# Patient Record
Sex: Male | Born: 1990 | Race: White | Hispanic: No
Health system: Southern US, Community
[De-identification: ages and names within clinical notes are randomized; demographics above are authoritative.]

## PROBLEM LIST (undated history)

## (undated) DIAGNOSIS — E119 Type 2 diabetes mellitus without complications: Secondary | ICD-10-CM

## (undated) DIAGNOSIS — I10 Essential (primary) hypertension: Secondary | ICD-10-CM

## (undated) DIAGNOSIS — J40 Bronchitis, not specified as acute or chronic: Secondary | ICD-10-CM

## (undated) HISTORY — PX: TONSILLECTOMY: SUR1361

## (undated) HISTORY — DX: Type 2 diabetes mellitus without complications: E11.9

## (undated) HISTORY — PX: FRACTURE SURGERY: SHX138

---

## 2011-09-05 ENCOUNTER — Emergency Department: Payer: Self-pay | Admitting: Emergency Medicine

## 2013-08-13 ENCOUNTER — Emergency Department: Payer: Self-pay | Admitting: Emergency Medicine

## 2013-08-18 ENCOUNTER — Emergency Department: Payer: Self-pay | Admitting: Emergency Medicine

## 2014-09-22 ENCOUNTER — Encounter: Payer: Self-pay | Admitting: Emergency Medicine

## 2014-09-22 ENCOUNTER — Emergency Department
Admission: EM | Admit: 2014-09-22 | Discharge: 2014-09-22 | Disposition: A | Discharge status: Home / Self Care | Payer: Self-pay | Attending: Emergency Medicine | Admitting: Emergency Medicine

## 2014-09-22 DIAGNOSIS — R1032 Left lower quadrant pain: Secondary | ICD-10-CM | POA: Insufficient documentation

## 2014-09-22 DIAGNOSIS — R109 Unspecified abdominal pain: Secondary | ICD-10-CM

## 2014-09-22 DIAGNOSIS — R197 Diarrhea, unspecified: Secondary | ICD-10-CM | POA: Insufficient documentation

## 2014-09-22 DIAGNOSIS — R110 Nausea: Secondary | ICD-10-CM | POA: Insufficient documentation

## 2014-09-22 DIAGNOSIS — Z720 Tobacco use: Secondary | ICD-10-CM | POA: Insufficient documentation

## 2014-09-22 LAB — CBC WITH DIFFERENTIAL/PLATELET
BASOS ABS: 0.1 10*3/uL (ref 0–0.1)
BASOS PCT: 1 %
EOS ABS: 0.2 10*3/uL (ref 0–0.7)
Eosinophils Relative: 1 %
HCT: 46.3 % (ref 40.0–52.0)
HEMOGLOBIN: 15.2 g/dL (ref 13.0–18.0)
LYMPHS ABS: 2 10*3/uL (ref 1.0–3.6)
Lymphocytes Relative: 15 %
MCH: 27.8 pg (ref 26.0–34.0)
MCHC: 32.8 g/dL (ref 32.0–36.0)
MCV: 84.8 fL (ref 80.0–100.0)
Monocytes Absolute: 0.9 10*3/uL (ref 0.2–1.0)
Monocytes Relative: 6 %
NEUTROS PCT: 77 %
Neutro Abs: 10.8 10*3/uL — ABNORMAL HIGH (ref 1.4–6.5)
Platelets: 206 10*3/uL (ref 150–440)
RBC: 5.46 MIL/uL (ref 4.40–5.90)
RDW: 13.9 % (ref 11.5–14.5)
WBC: 13.9 10*3/uL — AB (ref 3.8–10.6)

## 2014-09-22 LAB — COMPREHENSIVE METABOLIC PANEL
ALBUMIN: 3.3 g/dL — AB (ref 3.5–5.0)
ALK PHOS: 56 U/L (ref 38–126)
ALT: 21 U/L (ref 17–63)
AST: 27 U/L (ref 15–41)
Anion gap: 7 (ref 5–15)
BUN: 10 mg/dL (ref 6–20)
CALCIUM: 8.9 mg/dL (ref 8.9–10.3)
CO2: 27 mmol/L (ref 22–32)
CREATININE: 0.94 mg/dL (ref 0.61–1.24)
Chloride: 105 mmol/L (ref 101–111)
GFR calc Af Amer: >60 mL/min (ref >60)
GFR calc non Af Amer: >60 mL/min (ref >60)
GLUCOSE: 147 mg/dL — AB (ref 65–99)
Potassium: 3.9 mmol/L (ref 3.5–5.1)
SODIUM: 139 mmol/L (ref 135–145)
Total Bilirubin: 0.7 mg/dL (ref 0.3–1.2)
Total Protein: 5.9 g/dL — ABNORMAL LOW (ref 6.5–8.1)

## 2014-09-22 LAB — URINALYSIS COMPLETE WITH MICROSCOPIC (ARMC ONLY)
Bilirubin Urine: NEGATIVE
Glucose, UA: NEGATIVE mg/dL
Hgb urine dipstick: NEGATIVE
Ketones, ur: NEGATIVE mg/dL
Nitrite: NEGATIVE
PH: 6 (ref 5.0–8.0)
PROTEIN: NEGATIVE mg/dL
Specific Gravity, Urine: 1.024 (ref 1.005–1.030)

## 2014-09-22 LAB — LIPASE, BLOOD: Lipase: 18 U/L — ABNORMAL LOW (ref 22–51)

## 2014-09-22 MED ORDER — ONDANSETRON HCL 4 MG/2ML IJ SOLN
4.0000 mg | Freq: Once | INTRAMUSCULAR | Status: DC
  Filled 2014-09-22: qty 2

## 2014-09-22 MED ORDER — SODIUM CHLORIDE 0.9 % IV BOLUS (SEPSIS): 1000.0000 mL | Freq: Once | INTRAVENOUS | Status: DC

## 2014-09-22 MED ORDER — MORPHINE SULFATE (PF) 4 MG/ML IV SOLN
4.0000 mg | Freq: Once | INTRAVENOUS | Status: DC
  Filled 2014-09-22: qty 1

## 2014-09-22 NOTE — ED Provider Notes (Addendum)
[facility] Emergency Department Provider Note  ____________________________________________  Time seen: Approximately 7:53 PM  I have reviewed the triage vital signs and the nursing notes.   HISTORY  Chief Complaint Abdominal Pain    HPI Jeremy Salas is a 24 y.o. male who is healthy presents today complaining of abdominal pain and loose stools. Started 2 days ago. Pain is mostly in the lower abdomen more on the right than the left. He has had no fever but felt warm. He has had decreased by mouth intake secondary to nausea but not actually vomiting. He has had no abdominal surgeries in his life. No melena bright red blood per rectum. He has had loose but not watery stools. He worked today. He does work in [employer]. No known sick contacts.  History reviewed. No pertinent past medical history.  There are no active problems to display for this patient.   History reviewed. No pertinent past surgical history.  No current outpatient prescriptions on file.  Allergies Review of patient's allergies indicates no known allergies.  No family history on file.  Social History Social History  Substance Use Topics  . Smoking status: Current Some Day Smoker  . Smokeless tobacco: None  . Alcohol Use: Yes    Review of Systems Constitutional: No fever/chills Eyes: No visual changes. ENT: No sore throat. Cardiovascular: Denies chest pain. Respiratory: Denies shortness of breath. Gastrointestinal: Positive abdominal pain.  Positive nausea, no vomiting.  Loose stools no frank watery diarrhea.  No constipation. Genitourinary: Negative for dysuria. Musculoskeletal: Negative for back pain. Skin: Negative for rash. Neurological: Negative for headaches, focal weakness or numbness. 10-point ROS otherwise negative.  ____________________________________________   PHYSICAL EXAM:  VITAL SIGNS: ED Triage Vitals  Enc Vitals Group     BP 09/22/14 1722 135/78 mmHg      Pulse Rate 09/22/14 1722 100     Resp 09/22/14 1722 20     Temp 09/22/14 1722 97.9 F (36.6 C)     Temp Source 09/22/14 1722 Oral     SpO2 09/22/14 1722 96 %     Weight 09/22/14 1722 300 lb (136.079 kg)     Height 09/22/14 1722 5\' 10"  (1.778 m)     Head Cir --      Peak Flow --      Pain Score 09/22/14 1723 8     Pain Loc --      Pain Edu? --      Excl. in GC? --     Constitutional: Alert and oriented. Well appearing and in no acute distress. Eyes: Conjunctivae are normal. PERRL. EOMI. Head: Atraumatic. Nose: No congestion/rhinnorhea. Mouth/Throat: Mucous membranes are moist.  Oropharynx non-erythematous. Neck: No stridor.   Cardiovascular: Normal rate, regular rhythm. Grossly normal heart sounds.  Good peripheral circulation. Respiratory: Normal respiratory effort.  No retractions. Lungs CTAB. Gastrointestinal: Soft obese, patient has lower abdominal discomfort, right greater than left but is present in both sides. No guarding or rebound No distention. No abdominal bruits. No CVA tenderness. Musculoskeletal: No lower extremity tenderness nor edema.  No joint effusions. Neurologic:  Normal speech and language. No gross focal neurologic deficits are appreciated. No gait instability. Skin:  Skin is warm, dry and intact. No rash noted. Psychiatric: Mood and affect are normal. Speech and behavior are normal.  ____________________________________________   LABS (all labs ordered are listed, but only abnormal results are displayed)  Labs Reviewed  CBC WITH DIFFERENTIAL/PLATELET - Abnormal; Notable for the following:  WBC 13.9 (*)    Neutro Abs 10.8 (*)    All other components within normal limits  COMPREHENSIVE METABOLIC PANEL - Abnormal; Notable for the following:    Glucose, Bld 147 (*)    Total Protein 5.9 (*)    Albumin 3.3 (*)    All other components within normal limits  LIPASE, BLOOD - Abnormal; Notable for the following:    Lipase 18 (*)    All other components  within normal limits  URINALYSIS COMPLETEWITH MICROSCOPIC ([facility] ONLY) - Abnormal; Notable for the following:    Color, Urine YELLOW (*)    APPearance CLEAR (*)    Leukocytes, UA TRACE (*)    Bacteria, UA RARE (*)    Squamous Epithelial / LPF 0-5 (*)    All other components within normal limits   ____________________________________________  _________________________________   PROCEDURES  Procedure(s) performed: None  Critical Care performed: None  ____________________________________________   INITIAL IMPRESSION / ASSESSMENT AND PLAN / ED COURSE  Pertinent labs & imaging results that were available during my care of the patient were reviewed by me and considered in my medical decision making (see chart for details).  Patient presents today complaining of loose stools, subjective fever at home, and lower abdominal pain. Actually have low suspicion for appendicitis given that he does have some lateralizing signs the right, we will obtain a CT scan to rule it out. Morbid obesity limits the exam to some extent. Cerumen could be an early appendicitis although I favor more likely gastroenteritis. We'll give the patient IV fluid. White count blood work otherwise noted. ____________________________________________   ----------------------------------------- 8:12 PM on 09/22/2014 -----------------------------------------  Patient refusing all interventions. He would like a work note but he does not wish IV or CT. He feels that this is gastroenteritis. He is likely right but given his pain and his obesity I was favoring CT. Given the patient refuses CT scan pain medication or any further dimension and is requesting discharge we will discharge him. Since her risk benefits and alternative refusing CT. He specifically understands that a missed appendicitis can cause either more invasive surgery, IV antibiotics for prolonged period or even death. Patient is aware of these risks and he  understands he must come back if he feels worse in any way. He understands the limitations based upon the emergency department by his refusal of further intervention and care. All that being said, the patient's abdomen is still benign at this time he does have minimal lower abdominal discomfort which does still favor the right over the left. Patient declined GU exam.  FINAL CLINICAL IMPRESSION(S) / ED DIAGNOSES  Final diagnoses:  None      Jeanmarie Plant, MD 09/22/14 1955  Jeanmarie Plant, MD 09/22/14 2013

## 2014-09-22 NOTE — ED Notes (Signed)
Into patient's room to start IV, IVF, and medicate for pain as ordered.  Patient refusing IV.  Discussed treatment and plan of care and need for IV.  Patient states understanding and refuses IV.  Dr. Alphonzo Lemmings notified.

## 2014-09-22 NOTE — Discharge Instructions (Signed)
U have lower abdominal pain. Sometimes, this can be appendicitis. We have offered to do an appendicitis workup including CT and you have refused. This is certainly your choice however does mean that we cannot rule out appendicitis. Appendicitis, if left untreated, can cause significant pathology. There are other possible problems in your abdomen that we cannot find without a CT as well. All this being the case, we strongly advised you  to return to the emergency room if youy have any new or worrisome symptoms including increased pain, fever, vomiting, or feel worse in any way or change her mind about further workup.

## 2014-09-22 NOTE — ED Notes (Signed)
Lower abd cramps, nausea without emesis, some diarrhea

## 2014-09-22 NOTE — ED Notes (Signed)
AAOx3.  Skin warm and dry.  NAD. Ambulates with easy and steady gait.  NAD.  D/C home

## 2014-09-24 ENCOUNTER — Emergency Department: Payer: Self-pay

## 2014-09-24 ENCOUNTER — Emergency Department
Admission: EM | Admit: 2014-09-24 | Discharge: 2014-09-24 | Disposition: A | Discharge status: Home / Self Care | Payer: Self-pay | Attending: Emergency Medicine | Admitting: Emergency Medicine

## 2014-09-24 ENCOUNTER — Encounter: Payer: Self-pay | Admitting: Emergency Medicine

## 2014-09-24 DIAGNOSIS — R1084 Generalized abdominal pain: Secondary | ICD-10-CM

## 2014-09-24 DIAGNOSIS — K5732 Diverticulitis of large intestine without perforation or abscess without bleeding: Secondary | ICD-10-CM | POA: Insufficient documentation

## 2014-09-24 DIAGNOSIS — Z720 Tobacco use: Secondary | ICD-10-CM | POA: Insufficient documentation

## 2014-09-24 LAB — LIPASE, BLOOD: Lipase: 25 U/L (ref 22–51)

## 2014-09-24 LAB — URINALYSIS COMPLETE WITH MICROSCOPIC (ARMC ONLY)
BACTERIA UA: NONE SEEN
BILIRUBIN URINE: NEGATIVE
GLUCOSE, UA: NEGATIVE mg/dL
HGB URINE DIPSTICK: NEGATIVE
Ketones, ur: NEGATIVE mg/dL
LEUKOCYTES UA: NEGATIVE
NITRITE: NEGATIVE
Protein, ur: NEGATIVE mg/dL
SPECIFIC GRAVITY, URINE: 1.019 (ref 1.005–1.030)
SQUAMOUS EPITHELIAL / LPF: NONE SEEN
pH: 8 (ref 5.0–8.0)

## 2014-09-24 LAB — COMPREHENSIVE METABOLIC PANEL
ALBUMIN: 3.3 g/dL — AB (ref 3.5–5.0)
ALK PHOS: 49 U/L (ref 38–126)
ALT: 19 U/L (ref 17–63)
AST: 22 U/L (ref 15–41)
Anion gap: 5 (ref 5–15)
BILIRUBIN TOTAL: 0.7 mg/dL (ref 0.3–1.2)
BUN: 9 mg/dL (ref 6–20)
CO2: 28 mmol/L (ref 22–32)
CREATININE: 0.81 mg/dL (ref 0.61–1.24)
Calcium: 8.6 mg/dL — ABNORMAL LOW (ref 8.9–10.3)
Chloride: 105 mmol/L (ref 101–111)
GFR calc Af Amer: >60 mL/min (ref >60)
GLUCOSE: 125 mg/dL — AB (ref 65–99)
Potassium: 4 mmol/L (ref 3.5–5.1)
Sodium: 138 mmol/L (ref 135–145)
TOTAL PROTEIN: 6.1 g/dL — AB (ref 6.5–8.1)

## 2014-09-24 LAB — CBC
HEMATOCRIT: 40.5 % (ref 40.0–52.0)
Hemoglobin: 13.7 g/dL (ref 13.0–18.0)
MCH: 28.7 pg (ref 26.0–34.0)
MCHC: 33.8 g/dL (ref 32.0–36.0)
MCV: 85.1 fL (ref 80.0–100.0)
PLATELETS: 183 10*3/uL (ref 150–440)
RBC: 4.76 MIL/uL (ref 4.40–5.90)
RDW: 13.9 % (ref 11.5–14.5)
WBC: 9.9 10*3/uL (ref 3.8–10.6)

## 2014-09-24 MED ORDER — CIPROFLOXACIN HCL 500 MG PO TABS: 500.0000 mg | ORAL_TABLET | Freq: Two times a day (BID) | ORAL | Status: AC

## 2014-09-24 MED ORDER — SODIUM CHLORIDE 0.9 % IV BOLUS (SEPSIS)
1000.0000 mL | Freq: Once | INTRAVENOUS | Status: AC
  Administered 2014-09-24: 1000 mL via INTRAVENOUS

## 2014-09-24 MED ORDER — METOCLOPRAMIDE HCL 5 MG PO TABS: 5.0000 mg | ORAL_TABLET | Freq: Three times a day (TID) | ORAL | Status: DC

## 2014-09-24 MED ORDER — TRAMADOL HCL 50 MG PO TABS: 50.0000 mg | ORAL_TABLET | Freq: Four times a day (QID) | ORAL | Status: AC | PRN

## 2014-09-24 MED ORDER — IOHEXOL 300 MG/ML  SOLN
125.0000 mL | Freq: Once | INTRAMUSCULAR | Status: AC | PRN
  Administered 2014-09-24: 125 mL via INTRAVENOUS
  Filled 2014-09-24: qty 125

## 2014-09-24 MED ORDER — METRONIDAZOLE 500 MG PO TABS: 500.0000 mg | ORAL_TABLET | Freq: Two times a day (BID) | ORAL | Status: DC

## 2014-09-24 MED ORDER — IOHEXOL 240 MG/ML SOLN
25.0000 mL | Freq: Once | INTRAMUSCULAR | Status: AC | PRN
  Administered 2014-09-24: 25 mL via ORAL

## 2014-09-24 NOTE — ED Notes (Signed)
Pt to ed with c/o abd pain since sat.  Pt was seen here for same on sat.  Pt states pain in central abd.  Pt states "it feels like I need to go to the bathroom"  Pt denies n/v/d.  Pt appears in nad at triage.

## 2014-09-24 NOTE — ED Provider Notes (Signed)
[facility] Emergency Department Provider Note  ____________________________________________  Time seen: 1420  I have reviewed the triage vital signs and the nursing notes.   HISTORY  Chief Complaint Abdominal Pain     HPI Jeremy Salas is a 24 y.o. male who was seen in the emergency department 2 days ago with similar symptoms. He was having abdominal pain that began on Thursday. He's had some loose bowel movements, although he says that has decreased. He has had some nausea, but not now. He reports ongoing diffuse abdominal pain. Apparently when he was seen here on Saturday his pain was worse on the right than the left. Now it appears to be equal.  When the patient was seen here 2 days ago, the physician caring for him had wanted to get a CT scan on the patient. The patient refused and left the emergency department. The patient now returns due to ongoing discomfort and encouragement from his family. He is here with his grandfather.   History reviewed. No pertinent past medical history.  There are no active problems to display for this patient.   History reviewed. No pertinent past surgical history.  Current Outpatient Rx  Name  Route  Sig  Dispense  Refill  . ciprofloxacin (CIPRO) 500 MG tablet   Oral   Take 1 tablet (500 mg total) by mouth 2 (two) times daily.   14 tablet   0   . metoCLOPramide (REGLAN) 5 MG tablet   Oral   Take 1 tablet (5 mg total) by mouth 3 (three) times daily.   15 tablet   0   . metroNIDAZOLE (FLAGYL) 500 MG tablet   Oral   Take 1 tablet (500 mg total) by mouth 2 (two) times daily.   14 tablet   0   . traMADol (ULTRAM) 50 MG tablet   Oral   Take 1 tablet (50 mg total) by mouth every 6 (six) hours as needed.   20 tablet   0     Allergies Review of patient's allergies indicates no known allergies.  History reviewed. No pertinent family history.  Social History Social History  Substance Use Topics  .  Smoking status: Current Some Day Smoker  . Smokeless tobacco: None  . Alcohol Use: Yes    Review of Systems  Constitutional: Negative for fever. ENT: Negative for sore throat. Cardiovascular: Negative for chest pain. Respiratory: Negative for shortness of breath. Gastrointestinal: Positive for abdominal pain. Genitourinary: Negative for dysuria. Musculoskeletal: No myalgias or injuries. Skin: Negative for rash. Neurological: Negative for headaches   10-point ROS otherwise negative.  ____________________________________________   PHYSICAL EXAM:  VITAL SIGNS: ED Triage Vitals  Enc Vitals Group     BP 09/24/14 1139 128/75 mmHg     Pulse Rate 09/24/14 1139 78     Resp 09/24/14 1139 18     Temp 09/24/14 1139 98.2 F (36.8 C)     Temp Source 09/24/14 1139 Oral     SpO2 09/24/14 1139 97 %     Weight 09/24/14 1139 300 lb (136.079 kg)     Height 09/24/14 1139  (1.778 m)     Head Cir --      Peak Flow --      Pain Score 09/24/14 1147 9     Pain Loc --      Pain Edu? --      Excl. in GC? --     Constitutional:  Alert and oriented. Well appearing and  in no distress. ENT   Head: Normocephalic and atraumatic.   Nose: No congestion/rhinnorhea.   Mouth/Throat: Mucous membranes are moist. Cardiovascular: Normal rate, regular rhythm, no murmur noted Respiratory:  Normal respiratory effort, no tachypnea.    Breath sounds are clear and equal bilaterally.  Gastrointestinal: Soft, large, without distention. He has diffuse tenderness. No localization into the right or right lower quadrant. No peritoneal signs.  Back: No muscle spasm, no tenderness, no CVA tenderness. Musculoskeletal: No deformity noted. Nontender with normal range of motion in all extremities.  No noted edema. Neurologic:  Normal speech and language. No gross focal neurologic deficits are appreciated.  Skin:  Skin is warm, dry. No rash noted. Psychiatric: Mood and affect are normal. Speech and  behavior are normal.  ____________________________________________    LABS (pertinent positives/negatives)  Labs Reviewed  COMPREHENSIVE METABOLIC PANEL - Abnormal; Notable for the following:    Glucose, Bld 125 (*)    Calcium 8.6 (*)    Total Protein 6.1 (*)    Albumin 3.3 (*)    All other components within normal limits  URINALYSIS COMPLETEWITH MICROSCOPIC ([facility] ONLY) - Abnormal; Notable for the following:    Color, Urine YELLOW (*)    APPearance CLEAR (*)    All other components within normal limits  LIPASE, BLOOD  CBC     ____________________________________________  RADIOLOGY  CT scan abdomen and pelvis: IMPRESSION: Findings most consistent with diverticulitis of the splenic flexure of the colon. Wall thickening appears concentric and less consistent with the possibility of mass although followup to ensure no underlying abnormality recommended.  ____________________________________________   PROCEDURES    ____________________________________________   INITIAL IMPRESSION / ASSESSMENT AND PLAN / ED COURSE  Pertinent labs & imaging results that were available during my care of the patient were reviewed by me and considered in my medical decision making (see chart for details).  Pleasant, alert, 24 year old male with ongoing diffuse abdominal discomfort. I reviewed his lab results.   I've low suspicion for appendicitis and I discussed this with him and his grandfather. I've offered them the choice of treatment with just antibiotics for this loose stool and crampy abdominal pain, versus performing the CT scan that was initially considered 2 days ago. The patient and the grandfather prefer to have the CT done at this time. They understand the downside to radiation exposure, the risk and benefits of the procedure, and prefer this more thorough evaluation of this time.  ----------------------------------------- 4:32 PM on  09/24/2014 -----------------------------------------  CT scan shows findings consistent with diverticulitis of the sponge flexure of the colon.  Reassessment of the patient's time finds him comfortable, in no acute distress. I have outlined the diagnosis with him and the treatment plan.  We will start the patient on Cipro and metronidazole and asked to follow-up with another physician within 7-10 days.  I will discharge the patient with a prescription for Reglan and tramadol as well. I've also counseled him to use MiraLAX over the next 2-3 days.  ____________________________________________   FINAL CLINICAL IMPRESSION(S) / ED DIAGNOSES  Final diagnoses:  Generalized abdominal pain  Diverticulitis of large intestine without perforation or abscess without bleeding       Darien Ramus, MD 09/24/14 1640

## 2014-09-24 NOTE — ED Notes (Signed)
Pt IV infiltrated while in CT.

## 2014-09-24 NOTE — Discharge Instructions (Signed)
Diverticulitis °Diverticulitis is when small pockets that have formed in your colon (large intestine) become infected or swollen. °HOME CARE °· Follow your doctor's instructions. °· Follow a special diet if told by your doctor. °· When you feel better, your doctor may tell you to change your diet. You may be told to eat a lot of fiber. Fruits and vegetables are good sources of fiber. Fiber makes it easier to poop (have bowel movements). °· Take supplements or probiotics as told by your doctor. °· Only take medicines as told by your doctor. °· Keep all follow-up visits with your doctor. °GET HELP IF: °· Your pain does not get better. °· You have a hard time eating food. °· You are not pooping like normal. °GET HELP RIGHT AWAY IF: °· Your pain gets worse. °· Your problems do not get better. °· Your problems suddenly get worse. °· You have a fever. °· You keep throwing up (vomiting). °· You have bloody or black, tarry poop (stool). °MAKE SURE YOU:  °· Understand these instructions. °· Will watch your condition. °· Will get help right away if you are not doing well or get worse. °Document Released: 06/10/2007 Document Revised: 12/27/2012 Document Reviewed: 11/16/2012 °[employer]® Patient Information ©2015 [employer]. This information is not intended to replace advice given to you by your health care provider. Make sure you discuss any questions you have with your health care provider. ° °

## 2014-09-24 NOTE — ED Notes (Signed)
Pt back from CT

## 2014-11-26 ENCOUNTER — Emergency Department
Admission: EM | Admit: 2014-11-26 | Discharge: 2014-11-26 | Disposition: A | Discharge status: Home / Self Care | Payer: Self-pay | Attending: Emergency Medicine | Admitting: Emergency Medicine

## 2014-11-26 ENCOUNTER — Encounter: Payer: Self-pay | Admitting: Emergency Medicine

## 2014-11-26 DIAGNOSIS — R05 Cough: Secondary | ICD-10-CM

## 2014-11-26 DIAGNOSIS — Z79899 Other long term (current) drug therapy: Secondary | ICD-10-CM | POA: Insufficient documentation

## 2014-11-26 DIAGNOSIS — Z792 Long term (current) use of antibiotics: Secondary | ICD-10-CM | POA: Insufficient documentation

## 2014-11-26 DIAGNOSIS — R059 Cough, unspecified: Secondary | ICD-10-CM

## 2014-11-26 DIAGNOSIS — J029 Acute pharyngitis, unspecified: Secondary | ICD-10-CM | POA: Insufficient documentation

## 2014-11-26 DIAGNOSIS — R030 Elevated blood-pressure reading, without diagnosis of hypertension: Secondary | ICD-10-CM | POA: Insufficient documentation

## 2014-11-26 DIAGNOSIS — H109 Unspecified conjunctivitis: Secondary | ICD-10-CM | POA: Insufficient documentation

## 2014-11-26 DIAGNOSIS — H10023 Other mucopurulent conjunctivitis, bilateral: Secondary | ICD-10-CM

## 2014-11-26 DIAGNOSIS — F17200 Nicotine dependence, unspecified, uncomplicated: Secondary | ICD-10-CM | POA: Insufficient documentation

## 2014-11-26 DIAGNOSIS — J0100 Acute maxillary sinusitis, unspecified: Secondary | ICD-10-CM | POA: Insufficient documentation

## 2014-11-26 MED ORDER — PSEUDOEPH-BROMPHEN-DM 30-2-10 MG/5ML PO SYRP: 5.0000 mL | ORAL_SOLUTION | Freq: Four times a day (QID) | ORAL | Status: DC | PRN

## 2014-11-26 MED ORDER — GENTAMICIN SULFATE 0.3 % OP SOLN: 1.0000 [drp] | Freq: Three times a day (TID) | OPHTHALMIC | Status: AC

## 2014-11-26 MED ORDER — AMOXICILLIN 500 MG PO CAPS: 500.0000 mg | ORAL_CAPSULE | Freq: Three times a day (TID) | ORAL | Status: DC

## 2014-11-26 NOTE — ED Provider Notes (Signed)
[facility] Emergency Department Provider Note  ____________________________________________  Time seen: Approximately 5:29 PM  I have reviewed the triage vital signs and the nursing notes.   HISTORY  Chief Complaint Cough    HPI Jeremy Salas is a 24 y.o. male patient complain a nonproductive cough for several days. Patient also states feeling hot and cold. Patient had bilateral eye drainage which is yellowish greenish in color. Patient also complaining of a sore throat. Patient stated there is nasal discharge which is thick and greenish in color.. Patient has been using over-the-counter cough medications with no relief. Patient denies any nausea vomiting diarrhea. Patient rates his pain discomfort as a 7/10.   History reviewed. No pertinent past medical history.  There are no active problems to display for this patient.   History reviewed. No pertinent past surgical history.  Current Outpatient Rx  Name  Route  Sig  Dispense  Refill  . amoxicillin (AMOXIL) 500 MG capsule   Oral   Take 1 capsule (500 mg total) by mouth 3 (three) times daily.   30 capsule   0   . brompheniramine-pseudoephedrine-DM 30-2-10 MG/5ML syrup   Oral   Take 5 mLs by mouth 4 (four) times daily as needed.   120 mL   0   . gentamicin (GARAMYCIN) 0.3 % ophthalmic solution   Both Eyes   Place 1 drop into both eyes 3 (three) times daily.   5 mL   0   . metoCLOPramide (REGLAN) 5 MG tablet   Oral   Take 1 tablet (5 mg total) by mouth 3 (three) times daily.   15 tablet   0   . metroNIDAZOLE (FLAGYL) 500 MG tablet   Oral   Take 1 tablet (500 mg total) by mouth 2 (two) times daily.   14 tablet   0   . traMADol (ULTRAM) 50 MG tablet   Oral   Take 1 tablet (50 mg total) by mouth every 6 (six) hours as needed.   20 tablet   0     Allergies Review of patient's allergies indicates no known allergies.  No family history on file.  Social History Social History   Substance Use Topics  . Smoking status: Current Some Day Smoker  . Smokeless tobacco: None  . Alcohol Use: Yes    Review of Systems Constitutional: No objective fever or chills Eyes: No visual changes. Bilateral eye discharge ENT: Sore throat. Nasal and sinus congestion  Cardiovascular: Denies chest pain. Respiratory: Denies shortness of breath. Nonproductive cough Gastrointestinal: No abdominal pain.  No nausea, no vomiting.  No diarrhea.  No constipation. Genitourinary: Negative for dysuria. Musculoskeletal: Negative for back pain. Skin: Negative for rash. Neurological: Negative for headaches, focal weakness or numbness. 10-point ROS otherwise negative.  ____________________________________________   PHYSICAL EXAM:  VITAL SIGNS: ED Triage Vitals  Enc Vitals Group     BP 11/26/14 1725 159/75 mmHg     Pulse Rate 11/26/14 1725 108     Resp 11/26/14 1725 18     Temp 11/26/14 1725 98.9 F (37.2 C)     Temp Source 11/26/14 1725 Oral     SpO2 11/26/14 1725 99 %     Weight 11/26/14 1725 300 lb (136.079 kg)     Height 11/26/14 1725  (1.778 m)     Head Cir --      Peak Flow --      Pain Score 11/26/14 1726 7     Pain Loc --  Pain Edu? --      Excl. in GC? --     Constitutional: Alert and oriented. Well appearing and in no acute distress. Eyes: Conjunctivae are erythematous. PERRL. EOMI. bilateral dry secretions Head: Atraumatic. Nose: Edematous nasal turbinates with thick nasal discharge Mouth/Throat: Mucous membranes are moist.  Oropharynx erythematous. Postnasal drainage Neck: No stridor.  No cervical spine tenderness to palpation. Hematological/Lymphatic/Immunilogical: No cervical lymphadenopathy. Cardiovascular: Normal rate, regular rhythm. Grossly normal heart sounds.  Good peripheral circulation. Elevated blood pressure Respiratory: Normal respiratory effort.  No retractions. Lungs CTAB. Gastrointestinal: Soft and nontender. No distention. No abdominal  bruits. No CVA tenderness. Musculoskeletal: No lower extremity tenderness nor edema.  No joint effusions. Neurologic:  Normal speech and language. No gross focal neurologic deficits are appreciated. No gait instability. Skin:  Skin is warm, dry and intact. No rash noted. Psychiatric: Mood and affect are normal. Speech and behavior are normal.  ____________________________________________   LABS (all labs ordered are listed, but only abnormal results are displayed)  Labs Reviewed - No data to display ____________________________________________  EKG   ____________________________________________  RADIOLOGY   ____________________________________________   PROCEDURES  Procedure(s) performed: None  Critical Care performed: No  ____________________________________________   INITIAL IMPRESSION / ASSESSMENT AND PLAN / ED COURSE  Pertinent labs & imaging results that were available during my care of the patient were reviewed by me and considered in my medical decision making (see chart for details).  Bacterial conjunctivitis. Sinusitis. Nonproductive cough. Patient get a prescription for Amoxil, Bromfed-DM, and Garamycin ophthalmic solution. Patient given a work note for 2 days. Patient advised [facility]. ____________________________________________   FINAL CLINICAL IMPRESSION(S) / ED DIAGNOSES  Final diagnoses:  Acute bacterial conjunctivitis, bilateral  Acute non-recurrent maxillary sinusitis  Cough      Joni ReiningRonald K Smith, PA-C 11/26/14 1745  Jennye MoccasinBrian S Quigley, MD 11/28/14 [phone]

## 2014-11-26 NOTE — ED Notes (Signed)
Cough and cold sx;s for couple of day    Possible fever and chills.. Also has had drainage to eyes and sore throat

## 2014-11-26 NOTE — Discharge Instructions (Signed)
Bacterial Conjunctivitis Bacterial conjunctivitis, commonly called pink eye, is an inflammation of the clear membrane that covers the white part of the eye (conjunctiva). The inflammation can also happen on the underside of the eyelids. The blood vessels in the conjunctiva become inflamed, causing the eye to become red or pink. Bacterial conjunctivitis may spread easily from one eye to another and from person to person (contagious).  CAUSES  Bacterial conjunctivitis is caused by bacteria. The bacteria may come from your own skin, your upper respiratory tract, or from someone else with bacterial conjunctivitis. SYMPTOMS  The normally white color of the eye or the underside of the eyelid is usually pink or red. The pink eye is usually associated with irritation, tearing, and some sensitivity to light. Bacterial conjunctivitis is often associated with a thick, yellowish discharge from the eye. The discharge may turn into a crust on the eyelids overnight, which causes your eyelids to stick together. If a discharge is present, there may also be some blurred vision in the affected eye. DIAGNOSIS  Bacterial conjunctivitis is diagnosed by your caregiver through an eye exam and the symptoms that you report. Your caregiver looks for changes in the surface tissues of your eyes, which may point to the specific type of conjunctivitis. A sample of any discharge may be collected on a cotton-tip swab if you have a severe case of conjunctivitis, if your cornea is affected, or if you keep getting repeat infections that do not respond to treatment. The sample will be sent to a lab to see if the inflammation is caused by a bacterial infection and to see if the infection will respond to antibiotic medicines. TREATMENT   Bacterial conjunctivitis is treated with antibiotics. Antibiotic eyedrops are most often used. However, antibiotic ointments are also available. Antibiotics pills are sometimes used. Artificial tears or eye  washes may ease discomfort. HOME CARE INSTRUCTIONS   To ease discomfort, apply a cool, clean washcloth to your eye for 10-20 minutes, 3-4 times a day.  Gently wipe away any drainage from your eye with a warm, wet washcloth or a cotton ball.  Wash your hands often with soap and water. Use paper towels to dry your hands.  Do not share towels or washcloths. This may spread the infection.  Change or wash your pillowcase every day.  You should not use eye makeup until the infection is gone.  Do not operate machinery or drive if your vision is blurred.  Stop using contact lenses. Ask your caregiver how to sterilize or replace your contacts before using them again. This depends on the type of contact lenses that you use.  When applying medicine to the infected eye, do not touch the edge of your eyelid with the eyedrop bottle or ointment tube. SEEK IMMEDIATE MEDICAL CARE IF:   Your infection has not improved within 3 days after beginning treatment.  You had yellow discharge from your eye and it returns.  You have increased eye pain.  Your eye redness is spreading.  Your vision becomes blurred.  You have a fever or persistent symptoms for more than 2-3 days.  You have a fever and your symptoms suddenly get worse.  You have facial pain, redness, or swelling. MAKE SURE YOU:   Understand these instructions.  Will watch your condition.  Will get help right away if you are not doing well or get worse.   This information is not intended to replace advice given to you by your health care provider. Make sure you   discuss any questions you have with your health care provider.   Document Released: 12/22/2004 Document Revised: 01/12/2014 Document Reviewed: 05/25/2011 Elsevier Interactive Patient Education 2016 Elsevier Inc.  Cough, Adult Coughing is a reflex that clears your throat and your airways. Coughing helps to heal and protect your lungs. It is normal to cough occasionally, but  a cough that happens with other symptoms or lasts a long time may be a sign of a condition that needs treatment. A cough may last only 2-3 weeks (acute), or it may last longer than 8 weeks (chronic). CAUSES Coughing is commonly caused by:  Breathing in substances that irritate your lungs.  A viral or bacterial respiratory infection.  Allergies.  Asthma.  Postnasal drip.  Smoking.  Acid backing up from the stomach into the esophagus (gastroesophageal reflux).  Certain medicines.  Chronic lung problems, including COPD (or rarely, lung cancer).  Other medical conditions such as heart failure. HOME CARE INSTRUCTIONS  Pay attention to any changes in your symptoms. Take these actions to help with your discomfort:  Take medicines only as told by your health care provider.  If you were prescribed an antibiotic medicine, take it as told by your health care provider. Do not stop taking the antibiotic even if you start to feel better.  Talk with your health care provider before you take a cough suppressant medicine.  Drink enough fluid to keep your urine clear or pale yellow.  If the air is dry, use a cold steam vaporizer or humidifier in your bedroom or your home to help loosen secretions.  Avoid anything that causes you to cough at work or at home.  If your cough is worse at night, try sleeping in a semi-upright position.  Avoid cigarette smoke. If you smoke, quit smoking. If you need help quitting, ask your health care provider.  Avoid caffeine.  Avoid alcohol.  Rest as needed. SEEK MEDICAL CARE IF:   You have new symptoms.  You cough up pus.  Your cough does not get better after 2-3 weeks, or your cough gets worse.  You cannot control your cough with suppressant medicines and you are losing sleep.  You develop pain that is getting worse or pain that is not controlled with pain medicines.  You have a fever.  You have unexplained weight loss.  You have night  sweats. SEEK IMMEDIATE MEDICAL CARE IF:  You cough up blood.  You have difficulty breathing.  Your heartbeat is very fast.   This information is not intended to replace advice given to you by your health care provider. Make sure you discuss any questions you have with your health care provider.   Document Released: 06/20/2010 Document Revised: 09/12/2014 Document Reviewed: 02/28/2014 Elsevier Interactive Patient Education [employer] Elsevier Inc.

## 2018-02-20 ENCOUNTER — Encounter: Payer: Self-pay | Admitting: Emergency Medicine

## 2018-02-20 ENCOUNTER — Emergency Department: Payer: Self-pay

## 2018-02-20 ENCOUNTER — Other Ambulatory Visit: Payer: Self-pay

## 2018-02-20 ENCOUNTER — Emergency Department
Admission: EM | Admit: 2018-02-20 | Discharge: 2018-02-20 | Disposition: A | Discharge status: Home / Self Care | Payer: Self-pay | Attending: Emergency Medicine | Admitting: Emergency Medicine

## 2018-02-20 DIAGNOSIS — Z87891 Personal history of nicotine dependence: Secondary | ICD-10-CM | POA: Insufficient documentation

## 2018-02-20 DIAGNOSIS — J101 Influenza due to other identified influenza virus with other respiratory manifestations: Secondary | ICD-10-CM | POA: Insufficient documentation

## 2018-02-20 DIAGNOSIS — Z79899 Other long term (current) drug therapy: Secondary | ICD-10-CM | POA: Insufficient documentation

## 2018-02-20 HISTORY — DX: Bronchitis, not specified as acute or chronic: J40

## 2018-02-20 LAB — INFLUENZA PANEL BY PCR (TYPE A & B)
INFLBPCR: POSITIVE — AB
Influenza A By PCR: NEGATIVE

## 2018-02-20 LAB — GROUP A STREP BY PCR: GROUP A STREP BY PCR: NOT DETECTED

## 2018-02-20 MED ORDER — PROMETHAZINE-DM 6.25-15 MG/5ML PO SYRP: 5.0000 mL | ORAL_SOLUTION | Freq: Four times a day (QID) | ORAL | 0 refills | Status: DC | PRN

## 2018-02-20 NOTE — ED Provider Notes (Signed)
[facility] Emergency Department Provider Note ____________________________________________  Time seen: 0920  I have reviewed the triage vital signs and the nursing notes.  HISTORY  Chief Complaint  Cough   HPI Jeremy Salas is a 28 y.o. male presents to the ER today with complaint of headache, runny nose, nasal congestion, sore throat and cough.  He reports this started 2 weeks ago.  The headache is located in the forehead.  He describes the pain as pressure.  He denies associated dizziness or visual changes.  He is blowing clear mucus out of his nose.  He denies difficulty swallowing.  The cough is nonproductive.  He is short of breath with coughing fits.  He denies chest pain or chest tightness.  He denies nausea, vomiting or diarrhea.  He denies fever or body aches but has had chills.  He has no history of asthma.  He has not tried anything over-the-counter for his symptoms.  He does not smoke.  He has not had sick contacts.  He did not get his flu shot.  Past Medical History:  Diagnosis Date  . Bronchitis     There are no active problems to display for this patient.   History reviewed. No pertinent surgical history.  Prior to Admission medications   Medication Sig Start Date End Date Taking? Authorizing Provider  metoCLOPramide (REGLAN) 5 MG tablet Take 1 tablet (5 mg total) by mouth 3 (three) times daily. 09/24/14   Darien Ramus, MD  promethazine-dextromethorphan (PROMETHAZINE-DM) 6.25-15 MG/5ML syrup Take 5 mLs by mouth 4 (four) times daily as needed for cough. 02/20/18   Lorre Munroe, NP    Allergies Patient has no known allergies.  History reviewed. No pertinent family history.  Social History Social History   Tobacco Use  . Smoking status: Former Smoker    Last attempt to quit: 2018    Years since quitting: 2.1  . Smokeless tobacco: Never Used  Substance Use Topics  . Alcohol use: Yes  . Drug use: No    Review of  Systems  Constitutional: Positive for chills negative for fever or body aches. Eyes: Negative for visual changes. ENT: Positive for runny nose, nasal congestion, sore throat negative for ear pain. Cardiovascular: Negative for chest pain or chest tightness. Respiratory: Positive for cough and shortness of breath. Gastrointestinal: Negative for abdominal pain, nausea, vomiting and diarrhea. Neurological: Positive for headache.  Negative for weakness, tingling or numbness. ____________________________________________  PHYSICAL EXAM:  VITAL SIGNS: ED Triage Vitals  Enc Vitals Group     BP 02/20/18 0832 (!) 141/94     Pulse Rate 02/20/18 0832 (!) 104     Resp 02/20/18 0832 16     Temp 02/20/18 0832 98.3 F (36.8 C)     Temp Source 02/20/18 0832 Oral     SpO2 02/20/18 0832 94 %     Weight 02/20/18 0833 (!) 330 lb (149.7 kg)     Height 02/20/18 0833 5\' 10"  (1.778 m)     Head Circumference --      Peak Flow --      Pain Score 02/20/18 0834 0     Pain Loc --      Pain Edu? --      Excl. in GC? --     Constitutional: Alert and oriented. Well appearing and in no distress. Head: Normocephalic without sinus tenderness.. Eyes: Conjunctivae are normal. PERRL. Normal extraocular movements Ears: Canals clear. TMs intact bilaterally. Nose: No congestion/rhinorrhea/epistaxis. Mouth/Throat: Mucous  membranes are moist.  Erythema noted of posterior pharynx with + PND.  No exudate noted. Hematological/Lymphatic/Immunological: No cervical lymphadenopathy. Cardiovascular: Tachycardic, regular rhythm.  Respiratory: Normal respiratory effort.  Diminished breath sounds throughout. Gastrointestinal: Soft and nontender. No distention. Neurologic: Normal speech and language. No gross focal neurologic deficits are appreciated. Skin:  Skin is warm, dry and intact. No rash noted.  ____________________________________________   RADIOLOGY  Imaging Orders     DG Chest 2  View  ____________________________________________  Labs   Lab Orders     Group A Strep by PCR ([facility] Only)     Influenza panel by PCR (type A & B)    INITIAL IMPRESSION / ASSESSMENT AND PLAN / ED COURSE  Headache, Runny Nose, Nasal Congestion, Cough, Shortness of Breath:  DDX include viral URI with cough, upper respiratory infection, acute bronchitis, CAP. Chest xray negative for atelectasis or pneumonia Rapid flu positive for B Advised him to take Ibuprofen and Tylenol OTC as needed for fever or body aches RX for Promethazine DM for cough ____________________________________________  FINAL CLINICAL IMPRESSION(S) / ED DIAGNOSES  Final diagnoses:  Influenza B   Nicki Reaper, NP    Lorre Munroe, NP 02/20/18 1059    Don Perking, [place], MD 02/20/18 1408

## 2018-02-20 NOTE — ED Triage Notes (Signed)
Pt presents to ED via POV c/o non-productive cough and congestion since 02/16/18.

## 2018-02-20 NOTE — Discharge Instructions (Addendum)
You were seen for cough.  Your chest x-ray was negative for infection.  Your strep test was negative.  You tested positive for flu a flu B.  You are outside the window for treatment with Tamiflu at this time.  You can take Tylenol or ibuprofen as needed for fever or body aches.  I am providing you with a prior prescription for cough syrup.  Please follow-up if symptoms persist or worsen.

## 2018-11-29 ENCOUNTER — Inpatient Hospital Stay (HOSPITAL_COMMUNITY)
Admission: EM | Admit: 2018-11-29 | Discharge: 2018-12-04 | DRG: 956 | Disposition: A | Discharge status: Home Health | Payer: No Typology Code available for payment source | Attending: General Surgery | Admitting: General Surgery

## 2018-11-29 ENCOUNTER — Encounter (HOSPITAL_COMMUNITY): Payer: Self-pay

## 2018-11-29 ENCOUNTER — Emergency Department (HOSPITAL_COMMUNITY): Payer: No Typology Code available for payment source

## 2018-11-29 DIAGNOSIS — D62 Acute posthemorrhagic anemia: Secondary | ICD-10-CM | POA: Diagnosis not present

## 2018-11-29 DIAGNOSIS — S72301A Unspecified fracture of shaft of right femur, initial encounter for closed fracture: Secondary | ICD-10-CM | POA: Diagnosis present

## 2018-11-29 DIAGNOSIS — Z419 Encounter for procedure for purposes other than remedying health state, unspecified: Secondary | ICD-10-CM

## 2018-11-29 DIAGNOSIS — Z20828 Contact with and (suspected) exposure to other viral communicable diseases: Secondary | ICD-10-CM | POA: Diagnosis present

## 2018-11-29 DIAGNOSIS — Z041 Encounter for examination and observation following transport accident: Secondary | ICD-10-CM

## 2018-11-29 DIAGNOSIS — S022XXB Fracture of nasal bones, initial encounter for open fracture: Secondary | ICD-10-CM | POA: Diagnosis present

## 2018-11-29 DIAGNOSIS — S0003XA Contusion of scalp, initial encounter: Secondary | ICD-10-CM | POA: Diagnosis present

## 2018-11-29 DIAGNOSIS — S72321A Displaced transverse fracture of shaft of right femur, initial encounter for closed fracture: Secondary | ICD-10-CM | POA: Diagnosis not present

## 2018-11-29 DIAGNOSIS — T148XXA Other injury of unspecified body region, initial encounter: Secondary | ICD-10-CM

## 2018-11-29 DIAGNOSIS — Y907 Blood alcohol level of 200-239 mg/100 ml: Secondary | ICD-10-CM | POA: Diagnosis present

## 2018-11-29 DIAGNOSIS — Z6841 Body Mass Index (BMI) 40.0 and over, adult: Secondary | ICD-10-CM

## 2018-11-29 DIAGNOSIS — E860 Dehydration: Secondary | ICD-10-CM | POA: Diagnosis not present

## 2018-11-29 DIAGNOSIS — S36892A Contusion of other intra-abdominal organs, initial encounter: Secondary | ICD-10-CM

## 2018-11-29 DIAGNOSIS — Z23 Encounter for immunization: Secondary | ICD-10-CM

## 2018-11-29 DIAGNOSIS — E1165 Type 2 diabetes mellitus with hyperglycemia: Secondary | ICD-10-CM | POA: Diagnosis present

## 2018-11-29 DIAGNOSIS — S022XXA Fracture of nasal bones, initial encounter for closed fracture: Secondary | ICD-10-CM

## 2018-11-29 DIAGNOSIS — F10120 Alcohol abuse with intoxication, uncomplicated: Secondary | ICD-10-CM | POA: Diagnosis present

## 2018-11-29 DIAGNOSIS — E119 Type 2 diabetes mellitus without complications: Secondary | ICD-10-CM

## 2018-11-29 DIAGNOSIS — E041 Nontoxic single thyroid nodule: Secondary | ICD-10-CM | POA: Diagnosis present

## 2018-11-29 DIAGNOSIS — F1220 Cannabis dependence, uncomplicated: Secondary | ICD-10-CM | POA: Diagnosis present

## 2018-11-29 DIAGNOSIS — F1010 Alcohol abuse, uncomplicated: Secondary | ICD-10-CM | POA: Diagnosis present

## 2018-11-29 DIAGNOSIS — S7291XA Unspecified fracture of right femur, initial encounter for closed fracture: Secondary | ICD-10-CM

## 2018-11-29 DIAGNOSIS — F17210 Nicotine dependence, cigarettes, uncomplicated: Secondary | ICD-10-CM | POA: Diagnosis present

## 2018-11-29 DIAGNOSIS — T1490XA Injury, unspecified, initial encounter: Secondary | ICD-10-CM

## 2018-11-29 DIAGNOSIS — S301XXA Contusion of abdominal wall, initial encounter: Secondary | ICD-10-CM | POA: Diagnosis present

## 2018-11-29 DIAGNOSIS — R739 Hyperglycemia, unspecified: Secondary | ICD-10-CM

## 2018-11-29 HISTORY — DX: Unspecified fracture of right femur, initial encounter for closed fracture: S72.91XA
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
## 2018-11-30 ENCOUNTER — Emergency Department (HOSPITAL_COMMUNITY): Payer: No Typology Code available for payment source

## 2018-11-30 ENCOUNTER — Inpatient Hospital Stay (HOSPITAL_COMMUNITY): Payer: No Typology Code available for payment source

## 2018-11-30 ENCOUNTER — Encounter (HOSPITAL_COMMUNITY): Admission: EM | Disposition: A | Discharge status: Home Health | Payer: Self-pay | Source: Home / Self Care

## 2018-11-30 ENCOUNTER — Inpatient Hospital Stay (HOSPITAL_COMMUNITY): Payer: No Typology Code available for payment source | Admitting: Certified Registered Nurse Anesthetist

## 2018-11-30 ENCOUNTER — Other Ambulatory Visit: Payer: Self-pay

## 2018-11-30 ENCOUNTER — Encounter (HOSPITAL_COMMUNITY): Payer: Self-pay | Admitting: General Practice

## 2018-11-30 DIAGNOSIS — D62 Acute posthemorrhagic anemia: Secondary | ICD-10-CM | POA: Diagnosis not present

## 2018-11-30 DIAGNOSIS — F1220 Cannabis dependence, uncomplicated: Secondary | ICD-10-CM | POA: Diagnosis present

## 2018-11-30 DIAGNOSIS — S72301A Unspecified fracture of shaft of right femur, initial encounter for closed fracture: Secondary | ICD-10-CM | POA: Diagnosis present

## 2018-11-30 DIAGNOSIS — Z23 Encounter for immunization: Secondary | ICD-10-CM | POA: Diagnosis not present

## 2018-11-30 DIAGNOSIS — E041 Nontoxic single thyroid nodule: Secondary | ICD-10-CM | POA: Diagnosis present

## 2018-11-30 DIAGNOSIS — S36892A Contusion of other intra-abdominal organs, initial encounter: Secondary | ICD-10-CM | POA: Diagnosis present

## 2018-11-30 DIAGNOSIS — F10120 Alcohol abuse with intoxication, uncomplicated: Secondary | ICD-10-CM | POA: Diagnosis present

## 2018-11-30 DIAGNOSIS — F17210 Nicotine dependence, cigarettes, uncomplicated: Secondary | ICD-10-CM | POA: Diagnosis present

## 2018-11-30 DIAGNOSIS — Z6841 Body Mass Index (BMI) 40.0 and over, adult: Secondary | ICD-10-CM | POA: Diagnosis not present

## 2018-11-30 DIAGNOSIS — Y907 Blood alcohol level of 200-239 mg/100 ml: Secondary | ICD-10-CM | POA: Diagnosis present

## 2018-11-30 DIAGNOSIS — S0003XA Contusion of scalp, initial encounter: Secondary | ICD-10-CM | POA: Diagnosis present

## 2018-11-30 DIAGNOSIS — S022XXB Fracture of nasal bones, initial encounter for open fracture: Secondary | ICD-10-CM | POA: Diagnosis present

## 2018-11-30 DIAGNOSIS — S72321A Displaced transverse fracture of shaft of right femur, initial encounter for closed fracture: Secondary | ICD-10-CM | POA: Diagnosis present

## 2018-11-30 DIAGNOSIS — S301XXA Contusion of abdominal wall, initial encounter: Secondary | ICD-10-CM | POA: Diagnosis present

## 2018-11-30 DIAGNOSIS — Z20828 Contact with and (suspected) exposure to other viral communicable diseases: Secondary | ICD-10-CM | POA: Diagnosis present

## 2018-11-30 DIAGNOSIS — E860 Dehydration: Secondary | ICD-10-CM | POA: Diagnosis present

## 2018-11-30 DIAGNOSIS — E1165 Type 2 diabetes mellitus with hyperglycemia: Secondary | ICD-10-CM | POA: Diagnosis present

## 2018-11-30 HISTORY — PX: FEMUR IM NAIL: SHX1597

## 2018-11-30 LAB — ETHANOL: Alcohol, Ethyl (B): 204 mg/dL — ABNORMAL HIGH (ref <10)

## 2018-11-30 LAB — CBC
HCT: 37.1 % — ABNORMAL LOW (ref 39.0–52.0)
HCT: 38.1 % — ABNORMAL LOW (ref 39.0–52.0)
HCT: 41.2 % (ref 39.0–52.0)
HCT: 45.8 % (ref 39.0–52.0)
Hemoglobin: 12.1 g/dL — ABNORMAL LOW (ref 13.0–17.0)
Hemoglobin: 12.4 g/dL — ABNORMAL LOW (ref 13.0–17.0)
Hemoglobin: 13.7 g/dL (ref 13.0–17.0)
Hemoglobin: 14.9 g/dL (ref 13.0–17.0)
MCH: 28 pg (ref 26.0–34.0)
MCH: 28.4 pg (ref 26.0–34.0)
MCH: 28.4 pg (ref 26.0–34.0)
MCH: 28.4 pg (ref 26.0–34.0)
MCHC: 32.5 g/dL (ref 30.0–36.0)
MCHC: 32.5 g/dL (ref 30.0–36.0)
MCHC: 32.6 g/dL (ref 30.0–36.0)
MCHC: 33.3 g/dL (ref 30.0–36.0)
MCV: 85.5 fL (ref 80.0–100.0)
MCV: 85.9 fL (ref 80.0–100.0)
MCV: 87.4 fL (ref 80.0–100.0)
MCV: 87.4 fL (ref 80.0–100.0)
Platelets: 220 10*3/uL (ref 150–400)
Platelets: 233 10*3/uL (ref 150–400)
Platelets: 275 10*3/uL (ref 150–400)
Platelets: 327 10*3/uL (ref 150–400)
RBC: 4.32 MIL/uL (ref 4.22–5.81)
RBC: 4.36 MIL/uL (ref 4.22–5.81)
RBC: 4.82 MIL/uL (ref 4.22–5.81)
RBC: 5.24 MIL/uL (ref 4.22–5.81)
RDW: 12.9 % (ref 11.5–15.5)
RDW: 13.1 % (ref 11.5–15.5)
RDW: 13.2 % (ref 11.5–15.5)
RDW: 13.3 % (ref 11.5–15.5)
WBC: 10.7 10*3/uL — ABNORMAL HIGH (ref 4.0–10.5)
WBC: 15.2 10*3/uL — ABNORMAL HIGH (ref 4.0–10.5)
WBC: 15.5 10*3/uL — ABNORMAL HIGH (ref 4.0–10.5)
WBC: 9.6 10*3/uL (ref 4.0–10.5)
nRBC: 0 % (ref 0.0–0.2)
nRBC: 0 % (ref 0.0–0.2)
nRBC: 0 % (ref 0.0–0.2)
nRBC: 0 % (ref 0.0–0.2)

## 2018-11-30 LAB — I-STAT CHEM 8, ED
BUN: 12 mg/dL (ref 6–20)
Calcium, Ion: 1.09 mmol/L — ABNORMAL LOW (ref 1.15–1.40)
Chloride: 100 mmol/L (ref 98–111)
Creatinine, Ser: 1 mg/dL (ref 0.61–1.24)
Glucose, Bld: 335 mg/dL — ABNORMAL HIGH (ref 70–99)
HCT: 45 % (ref 39.0–52.0)
Hemoglobin: 15.3 g/dL (ref 13.0–17.0)
Potassium: 3.8 mmol/L (ref 3.5–5.1)
Sodium: 138 mmol/L (ref 135–145)
TCO2: 25 mmol/L (ref 22–32)

## 2018-11-30 LAB — COMPREHENSIVE METABOLIC PANEL
ALT: 152 U/L — ABNORMAL HIGH (ref 0–44)
AST: 150 U/L — ABNORMAL HIGH (ref 15–41)
Albumin: 3.5 g/dL (ref 3.5–5.0)
Alkaline Phosphatase: 78 U/L (ref 38–126)
Anion gap: 14 (ref 5–15)
BUN: 11 mg/dL (ref 6–20)
CO2: 22 mmol/L (ref 22–32)
Calcium: 8.7 mg/dL — ABNORMAL LOW (ref 8.9–10.3)
Chloride: 102 mmol/L (ref 98–111)
Creatinine, Ser: 0.93 mg/dL (ref 0.61–1.24)
GFR calc Af Amer: >60 mL/min (ref >60)
GFR calc non Af Amer: >60 mL/min (ref >60)
Glucose, Bld: 341 mg/dL — ABNORMAL HIGH (ref 70–99)
Potassium: 3.5 mmol/L (ref 3.5–5.1)
Sodium: 138 mmol/L (ref 135–145)
Total Bilirubin: 0.8 mg/dL (ref 0.3–1.2)
Total Protein: 6.5 g/dL (ref 6.5–8.1)

## 2018-11-30 LAB — LACTIC ACID, PLASMA
Lactic Acid, Venous: 3.6 mmol/L (ref 0.5–1.9)
Lactic Acid, Venous: 4.1 mmol/L (ref 0.5–1.9)
Lactic Acid, Venous: 4.5 mmol/L (ref 0.5–1.9)

## 2018-11-30 LAB — MRSA PCR SCREENING: MRSA by PCR: NEGATIVE

## 2018-11-30 LAB — HIV ANTIBODY (ROUTINE TESTING W REFLEX): HIV Screen 4th Generation wRfx: NONREACTIVE

## 2018-11-30 LAB — SAMPLE TO BLOOD BANK

## 2018-11-30 LAB — MAGNESIUM: Magnesium: 1.4 mg/dL — ABNORMAL LOW (ref 1.7–2.4)

## 2018-11-30 LAB — PROTIME-INR
INR: 1 (ref 0.8–1.2)
Prothrombin Time: 13.4 seconds (ref 11.4–15.2)

## 2018-11-30 LAB — CDS SEROLOGY

## 2018-11-30 LAB — SARS CORONAVIRUS 2 BY RT PCR (HOSPITAL ORDER, PERFORMED IN ~~LOC~~ HOSPITAL LAB): SARS Coronavirus 2: NEGATIVE

## 2018-11-30 SURGERY — INSERTION, INTRAMEDULLARY ROD, FEMUR, RETROGRADE
Anesthesia: General | Laterality: Right

## 2018-11-30 MED ORDER — METOCLOPRAMIDE HCL 5 MG PO TABS: 5.0000 mg | ORAL_TABLET | Freq: Three times a day (TID) | ORAL | Status: DC | PRN

## 2018-11-30 MED ORDER — IOHEXOL 300 MG/ML  SOLN
100.0000 mL | Freq: Once | INTRAMUSCULAR | Status: AC | PRN
  Administered 2018-11-30: 100 mL via INTRAVENOUS

## 2018-11-30 MED ORDER — FENTANYL CITRATE (PF) 250 MCG/5ML IJ SOLN
INTRAMUSCULAR | Status: DC | PRN
  Administered 2018-11-30 (×3): 50 ug via INTRAVENOUS
  Administered 2018-11-30: 150 ug via INTRAVENOUS

## 2018-11-30 MED ORDER — ONDANSETRON HCL 4 MG PO TABS: 4.0000 mg | ORAL_TABLET | Freq: Four times a day (QID) | ORAL | Status: DC | PRN

## 2018-11-30 MED ORDER — ONDANSETRON 4 MG PO TBDP: 4.0000 mg | ORAL_TABLET | Freq: Four times a day (QID) | ORAL | Status: DC | PRN

## 2018-11-30 MED ORDER — FENTANYL CITRATE (PF) 250 MCG/5ML IJ SOLN
INTRAMUSCULAR | Status: AC
  Filled 2018-11-30: qty 5

## 2018-11-30 MED ORDER — SUGAMMADEX SODIUM 200 MG/2ML IV SOLN
INTRAVENOUS | Status: DC | PRN
  Administered 2018-11-30: 400 mg via INTRAVENOUS

## 2018-11-30 MED ORDER — LACTATED RINGERS IV BOLUS
1000.0000 mL | Freq: Once | INTRAVENOUS | Status: AC
  Administered 2018-11-30: 1000 mL via INTRAVENOUS

## 2018-11-30 MED ORDER — KETAMINE HCL 10 MG/ML IJ SOLN
INTRAMUSCULAR | Status: DC | PRN
  Administered 2018-11-30: 50 mg via INTRAVENOUS
  Administered 2018-11-30: 20 mg via INTRAVENOUS

## 2018-11-30 MED ORDER — CEFAZOLIN SODIUM-DEXTROSE 2-4 GM/100ML-% IV SOLN
2.0000 g | Freq: Four times a day (QID) | INTRAVENOUS | Status: AC
  Administered 2018-11-30 – 2018-12-01 (×3): 2 g via INTRAVENOUS
  Filled 2018-11-30 (×3): qty 100

## 2018-11-30 MED ORDER — ONDANSETRON HCL 4 MG/2ML IJ SOLN
INTRAMUSCULAR | Status: AC
  Filled 2018-11-30: qty 4

## 2018-11-30 MED ORDER — FENTANYL CITRATE (PF) 100 MCG/2ML IJ SOLN
INTRAMUSCULAR | Status: AC | PRN
  Administered 2018-11-30: 100 ug via INTRAVENOUS

## 2018-11-30 MED ORDER — TRANEXAMIC ACID-NACL 1000-0.7 MG/100ML-% IV SOLN: 1000.0000 mg | INTRAVENOUS | Status: AC

## 2018-11-30 MED ORDER — DIPHENHYDRAMINE HCL 12.5 MG/5ML PO ELIX: 25.0000 mg | ORAL_SOLUTION | ORAL | Status: DC | PRN

## 2018-11-30 MED ORDER — PROPOFOL 10 MG/ML IV BOLUS
INTRAVENOUS | Status: AC
  Filled 2018-11-30: qty 20

## 2018-11-30 MED ORDER — LACTATED RINGERS IV SOLN
INTRAVENOUS | Status: DC
  Administered 2018-11-30 (×2): via INTRAVENOUS

## 2018-11-30 MED ORDER — OXYCODONE HCL 5 MG PO TABS
5.0000 mg | ORAL_TABLET | ORAL | Status: DC | PRN
  Administered 2018-12-01 (×3): 10 mg via ORAL
  Filled 2018-11-30 (×3): qty 2

## 2018-11-30 MED ORDER — METOPROLOL TARTRATE 50 MG PO TABS
50.0000 mg | ORAL_TABLET | Freq: Once | ORAL | Status: AC
  Administered 2018-11-30: 50 mg via ORAL
  Filled 2018-11-30: qty 1

## 2018-11-30 MED ORDER — FENTANYL CITRATE (PF) 100 MCG/2ML IJ SOLN
25.0000 ug | INTRAMUSCULAR | Status: DC | PRN
  Administered 2018-11-30 (×2): 50 ug via INTRAVENOUS

## 2018-11-30 MED ORDER — TRANEXAMIC ACID 1000 MG/10ML IV SOLN
2000.0000 mg | INTRAVENOUS | Status: AC
  Filled 2018-11-30: qty 20

## 2018-11-30 MED ORDER — SUCCINYLCHOLINE CHLORIDE 200 MG/10ML IV SOSY
PREFILLED_SYRINGE | INTRAVENOUS | Status: DC | PRN
  Administered 2018-11-30: 140 mg via INTRAVENOUS

## 2018-11-30 MED ORDER — METOCLOPRAMIDE HCL 5 MG/ML IJ SOLN: 5.0000 mg | Freq: Three times a day (TID) | INTRAMUSCULAR | Status: DC | PRN

## 2018-11-30 MED ORDER — DEXMEDETOMIDINE HCL IN NACL 200 MCG/50ML IV SOLN
INTRAVENOUS | Status: DC | PRN
  Administered 2018-11-30: 40 ug via INTRAVENOUS
  Administered 2018-11-30: 60 ug via INTRAVENOUS

## 2018-11-30 MED ORDER — ACETAMINOPHEN 325 MG PO TABS: 325.0000 mg | ORAL_TABLET | Freq: Four times a day (QID) | ORAL | Status: DC | PRN

## 2018-11-30 MED ORDER — KETOROLAC TROMETHAMINE 15 MG/ML IJ SOLN
15.0000 mg | Freq: Four times a day (QID) | INTRAMUSCULAR | Status: AC
  Administered 2018-11-30 – 2018-12-01 (×3): 15 mg via INTRAVENOUS
  Filled 2018-11-30 (×3): qty 1

## 2018-11-30 MED ORDER — ROCURONIUM BROMIDE 10 MG/ML (PF) SYRINGE
PREFILLED_SYRINGE | INTRAVENOUS | Status: DC | PRN
  Administered 2018-11-30 (×2): 40 mg via INTRAVENOUS
  Administered 2018-11-30: 60 mg via INTRAVENOUS

## 2018-11-30 MED ORDER — SORBITOL 70 % SOLN: 30.0000 mL | Freq: Every day | Status: DC | PRN

## 2018-11-30 MED ORDER — METHOCARBAMOL 1000 MG/10ML IJ SOLN
500.0000 mg | Freq: Four times a day (QID) | INTRAVENOUS | Status: DC | PRN
  Filled 2018-11-30: qty 5

## 2018-11-30 MED ORDER — DEXTROSE 50 % IV SOLN
INTRAVENOUS | Status: AC
  Filled 2018-11-30: qty 50

## 2018-11-30 MED ORDER — KCL IN DEXTROSE-NACL 20-5-0.45 MEQ/L-%-% IV SOLN
INTRAVENOUS | Status: DC
  Administered 2018-11-30: 06:00:00 via INTRAVENOUS
  Filled 2018-11-30: qty 1000

## 2018-11-30 MED ORDER — SUGAMMADEX SODIUM 500 MG/5ML IV SOLN
INTRAVENOUS | Status: AC
  Filled 2018-11-30: qty 5

## 2018-11-30 MED ORDER — FENTANYL CITRATE (PF) 100 MCG/2ML IJ SOLN
INTRAMUSCULAR | Status: AC
  Filled 2018-11-30: qty 2

## 2018-11-30 MED ORDER — ONDANSETRON HCL 4 MG/2ML IJ SOLN: 4.0000 mg | Freq: Four times a day (QID) | INTRAMUSCULAR | Status: DC | PRN

## 2018-11-30 MED ORDER — MAGNESIUM CITRATE PO SOLN: 1.0000 | Freq: Once | ORAL | Status: DC | PRN

## 2018-11-30 MED ORDER — MORPHINE SULFATE (PF) 4 MG/ML IV SOLN
4.0000 mg | INTRAVENOUS | Status: DC | PRN
  Administered 2018-11-30 – 2018-12-02 (×3): 4 mg via INTRAVENOUS
  Filled 2018-11-30 (×3): qty 1

## 2018-11-30 MED ORDER — OXYCODONE HCL 5 MG PO TABS
10.0000 mg | ORAL_TABLET | ORAL | Status: DC | PRN
  Administered 2018-11-30: 15 mg via ORAL
  Administered 2018-12-02: 10 mg via ORAL
  Filled 2018-11-30: qty 3
  Filled 2018-11-30: qty 2

## 2018-11-30 MED ORDER — SODIUM CHLORIDE 0.9 % IR SOLN
Status: DC | PRN
  Administered 2018-11-30: 3000 mL

## 2018-11-30 MED ORDER — FENTANYL CITRATE (PF) 100 MCG/2ML IJ SOLN
100.0000 ug | INTRAMUSCULAR | Status: DC | PRN
  Administered 2018-11-30: 100 ug via INTRAVENOUS
  Filled 2018-11-30: qty 2

## 2018-11-30 MED ORDER — PROPOFOL 10 MG/ML IV BOLUS
INTRAVENOUS | Status: DC | PRN
  Administered 2018-11-30: 300 mg via INTRAVENOUS

## 2018-11-30 MED ORDER — TETANUS-DIPHTH-ACELL PERTUSSIS 5-2.5-18.5 LF-MCG/0.5 IM SUSP
0.5000 mL | Freq: Once | INTRAMUSCULAR | Status: AC
  Administered 2018-11-30: 0.5 mL via INTRAMUSCULAR
  Filled 2018-11-30: qty 0.5

## 2018-11-30 MED ORDER — ENOXAPARIN SODIUM 40 MG/0.4ML ~~LOC~~ SOLN
40.0000 mg | SUBCUTANEOUS | Status: DC
  Administered 2018-12-01 – 2018-12-04 (×4): 40 mg via SUBCUTANEOUS
  Filled 2018-11-30 (×4): qty 0.4

## 2018-11-30 MED ORDER — DEXAMETHASONE SODIUM PHOSPHATE 10 MG/ML IJ SOLN
INTRAMUSCULAR | Status: DC | PRN
  Administered 2018-11-30: 5 mg via INTRAVENOUS

## 2018-11-30 MED ORDER — POVIDONE-IODINE 10 % EX SWAB: 2.0000 "application " | Freq: Once | CUTANEOUS | Status: DC

## 2018-11-30 MED ORDER — KETAMINE HCL 50 MG/5ML IJ SOSY
PREFILLED_SYRINGE | INTRAMUSCULAR | Status: AC
  Filled 2018-11-30: qty 10

## 2018-11-30 MED ORDER — DOCUSATE SODIUM 100 MG PO CAPS
100.0000 mg | ORAL_CAPSULE | Freq: Two times a day (BID) | ORAL | Status: DC
  Administered 2018-11-30 – 2018-12-03 (×6): 100 mg via ORAL
  Filled 2018-11-30 (×8): qty 1

## 2018-11-30 MED ORDER — ACETAMINOPHEN 500 MG PO TABS
1000.0000 mg | ORAL_TABLET | Freq: Four times a day (QID) | ORAL | Status: AC
  Administered 2018-11-30 – 2018-12-01 (×3): 1000 mg via ORAL
  Filled 2018-11-30 (×3): qty 2

## 2018-11-30 MED ORDER — LIDOCAINE 2% (20 MG/ML) 5 ML SYRINGE
INTRAMUSCULAR | Status: AC
  Filled 2018-11-30: qty 5

## 2018-11-30 MED ORDER — DEXAMETHASONE SODIUM PHOSPHATE 10 MG/ML IJ SOLN
INTRAMUSCULAR | Status: AC
  Filled 2018-11-30: qty 1

## 2018-11-30 MED ORDER — OXYCODONE HCL 5 MG/5ML PO SOLN
5.0000 mg | ORAL | Status: DC | PRN
  Administered 2018-11-30: 10 mg via ORAL
  Filled 2018-11-30: qty 10

## 2018-11-30 MED ORDER — METHOCARBAMOL 500 MG PO TABS
1000.0000 mg | ORAL_TABLET | Freq: Three times a day (TID) | ORAL | Status: DC
  Administered 2018-11-30 – 2018-12-04 (×14): 1000 mg via ORAL
  Filled 2018-11-30 (×15): qty 2

## 2018-11-30 MED ORDER — SODIUM CHLORIDE 0.9 % IV SOLN
INTRAVENOUS | Status: DC
  Administered 2018-11-30: 14:00:00 via INTRAVENOUS

## 2018-11-30 MED ORDER — DEXTROSE 5 % IV SOLN
INTRAVENOUS | Status: DC | PRN
  Administered 2018-11-30: 3 g via INTRAVENOUS

## 2018-11-30 MED ORDER — METHOCARBAMOL 500 MG PO TABS
500.0000 mg | ORAL_TABLET | Freq: Four times a day (QID) | ORAL | Status: DC | PRN
  Administered 2018-12-02: 500 mg via ORAL
  Filled 2018-11-30: qty 1

## 2018-11-30 MED ORDER — POLYETHYLENE GLYCOL 3350 17 G PO PACK: 17.0000 g | PACK | Freq: Every day | ORAL | Status: DC | PRN

## 2018-11-30 MED ORDER — LIDOCAINE 2% (20 MG/ML) 5 ML SYRINGE
INTRAMUSCULAR | Status: DC | PRN
  Administered 2018-11-30: 80 mg via INTRAVENOUS

## 2018-11-30 MED ORDER — DEXTROSE 5 % IV SOLN
3.0000 g | INTRAVENOUS | Status: DC
  Filled 2018-11-30: qty 3000

## 2018-11-30 MED ORDER — ONDANSETRON HCL 4 MG/2ML IJ SOLN: 4.0000 mg | Freq: Once | INTRAMUSCULAR | Status: DC | PRN

## 2018-11-30 MED ORDER — MIDAZOLAM HCL 2 MG/2ML IJ SOLN
INTRAMUSCULAR | Status: AC
  Filled 2018-11-30: qty 2

## 2018-11-30 MED ORDER — ONDANSETRON HCL 4 MG/2ML IJ SOLN
4.0000 mg | Freq: Four times a day (QID) | INTRAMUSCULAR | Status: DC | PRN
  Administered 2018-11-30 (×2): 4 mg via INTRAVENOUS
  Filled 2018-11-30: qty 2

## 2018-11-30 MED ORDER — MIDAZOLAM HCL 2 MG/2ML IJ SOLN
INTRAMUSCULAR | Status: DC | PRN
  Administered 2018-11-30: 2 mg via INTRAVENOUS

## 2018-11-30 MED ORDER — ROCURONIUM BROMIDE 10 MG/ML (PF) SYRINGE
PREFILLED_SYRINGE | INTRAVENOUS | Status: AC
  Filled 2018-11-30: qty 20

## 2018-11-30 MED ORDER — CEFAZOLIN SODIUM 1 G IJ SOLR
INTRAMUSCULAR | Status: AC
  Filled 2018-11-30: qty 20

## 2018-11-30 MED ORDER — DOCUSATE SODIUM 100 MG PO CAPS
100.0000 mg | ORAL_CAPSULE | Freq: Two times a day (BID) | ORAL | Status: DC
  Administered 2018-11-30: 100 mg via ORAL

## 2018-11-30 MED ORDER — SODIUM CHLORIDE 0.9 % IV BOLUS (SEPSIS)
2000.0000 mL | Freq: Once | INTRAVENOUS | Status: AC
  Administered 2018-11-30: 2000 mL via INTRAVENOUS

## 2018-11-30 MED ORDER — HYDROMORPHONE HCL 1 MG/ML IJ SOLN: 0.5000 mg | INTRAMUSCULAR | Status: DC | PRN

## 2018-11-30 MED ORDER — ONDANSETRON HCL 4 MG/2ML IJ SOLN
INTRAMUSCULAR | Status: AC
  Filled 2018-11-30: qty 2

## 2018-11-30 MED ORDER — SUCCINYLCHOLINE CHLORIDE 200 MG/10ML IV SOSY
PREFILLED_SYRINGE | INTRAVENOUS | Status: AC
  Filled 2018-11-30: qty 10

## 2018-11-30 MED ORDER — ACETAMINOPHEN 325 MG PO TABS
650.0000 mg | ORAL_TABLET | Freq: Four times a day (QID) | ORAL | Status: DC
  Administered 2018-11-30 – 2018-12-04 (×14): 650 mg via ORAL
  Filled 2018-11-30 (×14): qty 2

## 2018-11-30 SURGICAL SUPPLY — 63 items
ALCOHOL 70% 16 OZ (MISCELLANEOUS) ×3 IMPLANT
BIT DRILL CALIBRATED 4.3MMX365 (DRILL) IMPLANT
BIT DRILL CROWE PNT TWST 4.5MM (DRILL) IMPLANT
BLADE CLIPPER SURG (BLADE) IMPLANT
BLADE SURG 15 STRL LF DISP TIS (BLADE) ×1 IMPLANT
BLADE SURG 15 STRL SS (BLADE) ×2
BNDG ELASTIC 6X5.8 VLCR STR LF (GAUZE/BANDAGES/DRESSINGS) ×3 IMPLANT
BNDG GAUZE ELAST 4 BULKY (GAUZE/BANDAGES/DRESSINGS) ×3 IMPLANT
COVER SURGICAL LIGHT HANDLE (MISCELLANEOUS) ×3 IMPLANT
COVER WAND RF STERILE (DRAPES) ×3 IMPLANT
CUFF TOURN SGL QUICK 34 (TOURNIQUET CUFF)
CUFF TOURN SGL QUICK 42 (TOURNIQUET CUFF) IMPLANT
CUFF TRNQT CYL 34X4.125X (TOURNIQUET CUFF) IMPLANT
DRAPE C-ARM 42X72 X-RAY (DRAPES) ×3 IMPLANT
DRAPE HALF SHEET 40X57 (DRAPES) ×6 IMPLANT
DRAPE IMP U-DRAPE 54X76 (DRAPES) ×5 IMPLANT
DRAPE ORTHO SPLIT 77X108 STRL (DRAPES) ×4
DRAPE SURG ORHT 6 SPLT 77X108 (DRAPES) ×2 IMPLANT
DRILL CALIBRATED 4.3MMX365 (DRILL) ×3
DRILL CROWE POINT TWIST 4.5MM (DRILL) ×6
DRSG ADAPTIC 3X8 NADH LF (GAUZE/BANDAGES/DRESSINGS) ×3 IMPLANT
DRSG TEGADERM 4X4.75 (GAUZE/BANDAGES/DRESSINGS) ×2 IMPLANT
DRSG XEROFORM 1X8 (GAUZE/BANDAGES/DRESSINGS) ×2 IMPLANT
DURAPREP 26ML APPLICATOR (WOUND CARE) ×7 IMPLANT
ELECT REM PT RETURN 9FT ADLT (ELECTROSURGICAL) ×3
ELECTRODE REM PT RTRN 9FT ADLT (ELECTROSURGICAL) ×1 IMPLANT
GAUZE SPONGE 4X4 12PLY STRL (GAUZE/BANDAGES/DRESSINGS) ×6 IMPLANT
GAUZE SPONGE 4X4 12PLY STRL LF (GAUZE/BANDAGES/DRESSINGS) ×2 IMPLANT
GLOVE BIOGEL PI IND STRL 7.0 (GLOVE) ×1 IMPLANT
GLOVE BIOGEL PI INDICATOR 7.0 (GLOVE) ×2
GLOVE ECLIPSE 7.0 STRL STRAW (GLOVE) ×3 IMPLANT
GLOVE SKINSENSE NS SZ7.5 (GLOVE) ×2
GLOVE SKINSENSE STRL SZ7.5 (GLOVE) ×1 IMPLANT
GLOVE SURG SYN 7.5  E (GLOVE) ×2
GLOVE SURG SYN 7.5 E (GLOVE) ×1 IMPLANT
GLOVE SURG SYN 7.5 PF PI (GLOVE) ×1 IMPLANT
GOWN STRL REIN XL XLG (GOWN DISPOSABLE) ×3 IMPLANT
GOWN STRL REUS W/ TWL LRG LVL3 (GOWN DISPOSABLE) ×2 IMPLANT
GOWN STRL REUS W/TWL LRG LVL3 (GOWN DISPOSABLE) ×4
GUIDEWIRE BEAD TIP (WIRE) ×2 IMPLANT
KIT BASIN OR (CUSTOM PROCEDURE TRAY) ×3 IMPLANT
KIT TURNOVER KIT B (KITS) ×3 IMPLANT
MANIFOLD NEPTUNE II (INSTRUMENTS) ×3 IMPLANT
NAIL FEM RETRO 9X400 (Nail) ×2 IMPLANT
NEEDLE 22X1 1/2 (OR ONLY) (NEEDLE) ×3 IMPLANT
NS IRRIG 1000ML POUR BTL (IV SOLUTION) ×3 IMPLANT
PACK GENERAL/GYN (CUSTOM PROCEDURE TRAY) ×3 IMPLANT
PACK UNIVERSAL I (CUSTOM PROCEDURE TRAY) ×3 IMPLANT
PAD ARMBOARD 7.5X6 YLW CONV (MISCELLANEOUS) ×6 IMPLANT
PADDING CAST COTTON 6X4 STRL (CAST SUPPLIES) ×3 IMPLANT
SCREW CORT TI DBL LEAD 5X36 (Screw) ×4 IMPLANT
SCREW CORT TI DBL LEAD 5X58 (Screw) ×2 IMPLANT
SCREW CORT TI DBL LEAD 5X80 (Screw) ×2 IMPLANT
STAPLER VISISTAT 35W (STAPLE) IMPLANT
STOCKINETTE 6  STRL (DRAPES) ×2
STOCKINETTE 6 STRL (DRAPES) ×1 IMPLANT
SUT VIC AB 0 CT1 27 (SUTURE)
SUT VIC AB 0 CT1 27XBRD ANBCTR (SUTURE) IMPLANT
SUT VIC AB 2-0 CTB1 (SUTURE) IMPLANT
SYR 20ML ECCENTRIC (SYRINGE) ×3 IMPLANT
TOWEL GREEN STERILE (TOWEL DISPOSABLE) ×3 IMPLANT
TOWEL GREEN STERILE FF (TOWEL DISPOSABLE) ×3 IMPLANT
WATER STERILE IRR 1000ML POUR (IV SOLUTION) ×3 IMPLANT
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
## 2018-11-30 NOTE — Consult Note (Signed)
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 vertebral body fracture or compression deformity. Preservation of the normal thoracic kyphosis. Portions of the left forearm wrist and hand are included in the imaging without acute fracture or traumatic malalignment. Portion of the right hand is also included in the imaging. No acute osseous abnormality CT ABDOMEN AND PELVIS FINDINGS Hepatobiliary: No perihepatic hemorrhage or direct hepatic injury. Diffuse hepatic hypoattenuation compatible with hepatic steatosis. No focal liver abnormality is seen. No gallstones, gallbladder wall thickening, or biliary dilatation. Pancreas: No direct  pancreatic injury. No pancreatic ductal dilatation or surrounding inflammatory changes. Spleen: No splenic injury or perisplenic hematoma. No suspicious splenic lesions. Adrenals/Urinary Tract: No adrenal hemorrhage or suspicious adrenal lesions. No renal injury or perirenal hemorrhage. No extravasation of contrast is seen on excretory phase delayed imaging. Kidneys are otherwise unremarkable, without renal calculi, suspicious lesion, or hydronephrosis. Bladder is unremarkable. Stomach/Bowel: Distal esophagus, stomach and duodenal sweep are unremarkable. No small bowel wall thickening or dilatation. No evidence of obstruction. A normal appendix is visualized. No colonic dilatation or wall thickening. Pancolonic diverticula without focal pericolonic inflammation to suggest diverticulitis. Focal mesenteric contusion without evidence of acute active contrast extravasation or hyperdense contrast accumulation on delayed imaging in the right  lower quadrant (3/79. Vascular/Lymphatic: No direct vascular injury in the abdomen or pelvis. Soft tissue stranding in the anterior right groin, correlate for attempted site of vascular access. No suspicious or enlarged lymph nodes in the included lymphatic chains. Reproductive: The prostate and seminal vesicles are unremarkable. Other: No traumatic abdominal wall hematoma or abdominal wall hernia is seen. No bowel containing hernias. Small fat containing umbilical hernia. No free air or free fluid in the abdomen or pelvis. Musculoskeletal: Both femora are externally rotated. No proximal femur fractures are identified. Bones of the pelvis remain congruent without abnormal diastatic widening of the SI joints or pubic symphysis. Preservation of the normal lumbar lordosis. No traumatic listhesis. No vertebral body height loss or fracture is seen. Sacrum and coccyx are intact. IMPRESSION: 1. Large left frontal scalp hematoma with layering fluid-fluid levels suggesting some active bleeding  with mixed age blood products. No subjacent calvarial fracture. 2. No acute intracranial abnormality. 3. No acute cervical, thoracic or lumbar fracture or traumatic listhesis. 4. Mildly comminuted, minimally displaced fractures of the bilateral nasal bones with extension into the frontal processes of the maxillae. 5. Suspect chip fractures of the central mandibular incisors. 6. Left periorbital soft tissue swelling as well as soft tissue swelling and gas with laceration across the nasal bridge. Swelling across the soft tissues of the upper lip. 7. Right pre-auricular soft tissue thickening and laceration with soft tissue gas and punctate radiodensities likely reflecting radiodense debris. 8. No acute traumatic injury within the chest. 9. Mesenteric contusion in the right lower quadrant without evidence of active contrast extravasation or hyperdense contrast accumulation on delayed imaging. 10. No solid organ, hollow viscus, or osseous injury in the abdomen or pelvis. 11. Soft tissue thickening in the right groin, correlate for attempted vascular access. 12. Hepatic steatosis. These results were called by telephone at the time of interpretation on 11/30/2018 at 1:11 am to provider Zadie RhineNALD WICKLINE , who verbally acknowledged these results. Electronically Signed: By: Kreg ShropshirePrice  DeHay M.D. On: 11/30/2018 01:13   - pertinent xrays, CT, MRI studies were reviewed and independently interpreted  Positive ROS: All other systems have been reviewed and were otherwise negative with the exception of those mentioned in the HPI and as above.  Physical Exam: General: Alert, no acute distress Cardiovascular: No pedal edema Respiratory: No cyanosis, no use of accessory musculature GI: No organomegaly, abdomen is soft and non-tender Skin: No lesions in the area of chief complaint Neurologic: Sensation intact distally Psychiatric: Patient is competent for consent with normal mood and affect Lymphatic: No axillary or cervical  lymphadenopathy  MUSCULOSKELETAL:  - compartments soft - NVI distally - well fitting bucks  Assessment: Right femoral shaft fracture  Plan: - continue NPO - will take to OR for surgical repair later this morning - r/b/a discussed with patient, he agrees to proceed  Thank you for the consult and the opportunity to see Jeremy Salas  N. Glee ArvinMichael Ingris Pasquarella, MD [facility]  7:10 AM

## 2018-11-30 NOTE — Progress Notes (Signed)
Inpatient Diabetes Program Recommendations  AACE/ADA: New Consensus Statement on Inpatient Glycemic Control (2015)  Target Ranges:  Prepandial:   less than 140 mg/dL      Peak postprandial:   less than 180 mg/dL (1-2 hours)      Critically ill patients:  140 - 180 mg/dL    Review of Glycemic Control Results for HAN, VEJAR (MRN [id number]) as of 11/30/2018 13:59  Ref. Range 11/29/2018 23:51 11/30/2018 00:10  Glucose Latest Ref Range: 70 - 99 mg/dL 341 (H) 335 (H)   Diabetes history: None Current orders for Inpatient glycemic control:  None  Inpatient Diabetes Program Recommendations:   Note that blood sugar>300 mg/dL on admit.  Please add Novolog moderate correction with blood sugars tid with meals and HS.  Also please add A1C to labs.  Is this new DM?  Thanks, Adah Perl, RN, BC-ADM Inpatient Diabetes Coordinator Pager [phone] (8a-5p)

## 2018-11-30 NOTE — Progress Notes (Signed)
Orthopedic Tech Progress Note Patient Details:  Jeremy Salas [birth year] [id number]  Ortho Devices Type of Ortho Device: Post (long leg) splint Ortho Device/Splint Location: rle. temporary post long leg splint applied to stabilize leg for ct. applied at drs rerquest. Ortho Device/Splint Interventions: Ordered, Application, Adjustment   Post Interventions Patient Tolerated: Well Instructions Provided: Care of device, Adjustment of device   Karolee Stamps 11/30/2018, 3:09 AM
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
## 2018-11-30 NOTE — Progress Notes (Signed)
Orthopedic Tech Progress Note Patient Details:  Jeremy Salas 06/06/1990 [id number]  Musculoskeletal Traction Type of Traction: Bucks Skin Traction Traction Location: rle. traction applied post ct at drs request. Traction Weight: 10 lbs   Post Interventions Patient Tolerated: Well Instructions Provided: Care of device, Adjustment of device   Karolee Stamps 11/30/2018, 3:10 AM

## 2018-11-30 NOTE — Transfer of Care (Signed)
Immediate Anesthesia Transfer of Care Note  Patient: Jeremy Salas  Procedure(s) Performed: INTRAMEDULLARY (IM) RETROGRADE FEMORAL NAILING (Right )  Patient Location: PACU  Anesthesia Type:General  Level of Consciousness: drowsy and responds to stimulation  Airway & Oxygen Therapy: Patient Spontanous Breathing and Patient connected to face mask oxygen  Post-op Assessment: Report given to RN and Post -op Vital signs reviewed and stable  Post vital signs: Reviewed and stable  Last Vitals:  Vitals Value Taken Time  BP 104/58 11/30/18 1253  Temp    Pulse 97 11/30/18 1255  Resp 38 11/30/18 1255  SpO2 93 % 11/30/18 1255  Vitals shown include unvalidated device data.  Last Pain:  Vitals:   11/30/18 0900  TempSrc:   PainSc: 5          Complications: No apparent anesthesia complications

## 2018-11-30 NOTE — H&P (Signed)

## 2018-11-30 NOTE — Anesthesia Procedure Notes (Signed)
Procedure Name: Intubation Date/Time: 11/30/2018 10:50 AM Performed by: Janace Litten, CRNA Pre-anesthesia Checklist: Patient identified, Emergency Drugs available, Suction available and Patient being monitored Patient Re-evaluated:Patient Re-evaluated prior to induction Oxygen Delivery Method: Circle System Utilized Preoxygenation: Pre-oxygenation with 100% oxygen Induction Type: IV induction and Rapid sequence Laryngoscope Size: Mac and 4 Grade View: Grade I Tube type: Oral Tube size: 7.5 mm Number of attempts: 1 Airway Equipment and Method: Stylet Placement Confirmation: ETT inserted through vocal cords under direct vision,  positive ETCO2 and breath sounds checked- equal and bilateral Secured at: 22 cm Tube secured with: Tape Dental Injury: Teeth and Oropharynx as per pre-operative assessment
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
## 2018-11-30 NOTE — Progress Notes (Signed)
Notified on call phys- increase HR- new orders obtained

## 2018-11-30 NOTE — Anesthesia Preprocedure Evaluation (Signed)
Anesthesia Evaluation  Patient identified by MRN, date of birth, ID band Patient awake    Reviewed: Allergy & Precautions, NPO status , Patient's Chart, lab work & pertinent test results  Airway Mallampati: II  TM Distance: >3 FB Neck ROM: Full    Dental  (+) Dental Advisory Given   Pulmonary Current Smoker,    breath sounds clear to auscultation       Cardiovascular  Rhythm:Regular Rate:Normal     Neuro/Psych negative neurological ROS     GI/Hepatic Neg liver ROS, GERD  ,  Endo/Other  Morbid obesity  Renal/GU negative Renal ROS     Musculoskeletal   Abdominal   Peds  Hematology negative hematology ROS (+)   Anesthesia Other Findings   Reproductive/Obstetrics                             Lab Results  Component Value Date   WBC 15.2 (H) 11/30/2018   HGB 13.7 11/30/2018   HCT 41.2 11/30/2018   MCV 85.5 11/30/2018   PLT 275 11/30/2018   Lab Results  Component Value Date   CREATININE 1.00 11/30/2018   BUN 12 11/30/2018   NA 138 11/30/2018   K 3.8 11/30/2018   CL 100 11/30/2018   CO2 22 11/29/2018    Anesthesia Physical Anesthesia Plan  ASA: III  Anesthesia Plan: General   Post-op Pain Management:    Induction: Intravenous  PONV Risk Score and Plan: 1 and Ondansetron, Dexamethasone and Treatment may vary due to age or medical condition  Airway Management Planned: Oral ETT  Additional Equipment:   Intra-op Plan:   Post-operative Plan: Extubation in OR  Informed Consent: I have reviewed the patients History and Physical, chart, labs and discussed the procedure including the risks, benefits and alternatives for the proposed anesthesia with the patient or authorized representative who has indicated his/her understanding and acceptance.     Dental advisory given  Plan Discussed with: CRNA  Anesthesia Plan Comments:         Anesthesia Quick Evaluation
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
## 2018-11-30 NOTE — ED Notes (Signed)
Pt placed on hospital bed for ortho

## 2018-11-30 NOTE — Progress Notes (Signed)
OT Cancellation Note  Patient Details Name: Jeremy Salas MRN: [id number] DOB: April 04, 1990   Cancelled Treatment:    Reason Eval/Treat Not Completed: Patient not medically ready(in [facility] OR today) OT await post surgical orders for appropriateness.   Fleeta Emmer, OTR/L  Acute Rehabilitation Services Pager: [phone] Office: [phone] .  Jeri Modena 11/30/2018, 8:44 AM

## 2018-11-30 NOTE — ED Provider Notes (Signed)
With patient permission, I spoke to his girlfriend April via the phone. She was updated on the plan. Patient is awaiting admission.  Also found to have incidental thyroid nodule that will need to have follow-up ultrasound   Ripley Fraise, MD 11/30/18 7614

## 2018-11-30 NOTE — Progress Notes (Signed)
Patient report given to short stay, tele, no piercings, all jewelry off.  Patient aware he is going to surgery.  CHG bath completed, EKG completed, consents signed.

## 2018-11-30 NOTE — ED Notes (Signed)
Patient girlfriend April calling states she lives in another city would like an update on patient [phone] please call

## 2018-11-30 NOTE — Plan of Care (Signed)

## 2018-11-30 NOTE — Op Note (Signed)
   Date of Surgery: 11/30/2018  INDICATIONS: Jeremy Salas is a 28 y.o.-year-old male with a right femoral shaft fracture;  The patient did consent to the procedure after discussion of the risks and benefits.  PREOPERATIVE DIAGNOSIS: Right femoral shaft fracture  POSTOPERATIVE DIAGNOSIS: Same.  PROCEDURE: Retrograde intramedullary fixation of right femoral shaft fracture.  CPT [phone]  SURGEON: N. Eduard Roux, M.D.  ASSIST: Ciro Backer [place], [place]; necessary for the timely completion of procedure and due to complexity of procedure.  ANESTHESIA:  general  IV FLUIDS AND URINE: See anesthesia.  ESTIMATED BLOOD LOSS: 100 mL.  IMPLANTS: Biomet 9 x 40 retrograde femoral nail  DRAINS: none  COMPLICATIONS: see description of procedure.  DESCRIPTION OF PROCEDURE: The patient was brought to the operating room and placed supine on the operating table.  The patient had been signed prior to the procedure and this was documented. The patient had the anesthesia placed by the anesthesiologist.  [name], site, side and location. The patient did receive antibiotics prior to the incision and was re-dosed during the procedure as needed at indicated intervals.  The patient had the operative extremity prepped and draped in the standard surgical fashion.    Incision was made on the anterior aspect of the patella tendon.  Dissection was carried down to the peritenon which was elevated off of the patellar tendon which was then sharply incised in line with the fibers.  A Gelpi retractor was placed for visualization.  The starting guidepin was placed at the appropriate site using fluoroscopic guidance.  Tissue protector was placed and entry reamer was used to gain entry into the femoral canal.  A fracture reducer was then advanced up the femoral canal across the fracture well reduced.  Again a ball-tipped guide wire was then placed up into the proximal  femur at the appropriate depth.  The length of the nail was measured and sequential reaming was performed up to 10.5 mm with adequate chatter across the fracture.  The femoral nail was then advanced over the ball-tipped guidewire to the appropriate depth.  The fracture stayed reduced.  Distal interlocking screws were placed through the jig.  The nail was then gently malleted to gain compression across the fracture.  2 proximal interlocking screws were then placed using the perfect circle technique.  Final x-rays were taken.  Surgical wounds were thoroughly irrigated.  Layer closure was performed.  Sterile dressings were applied.  Patient tolerated procedure well had no me complications.  POSTOPERATIVE PLAN: Weight-bear as tolerated right lower extremity.  Mobilize with physical therapy.  May begin Lovenox 12 hours postop.  Azucena Cecil, MD 12:30 PM

## 2018-11-30 NOTE — Anesthesia Postprocedure Evaluation (Signed)
Anesthesia Post Note  Patient: Jeremy Salas  Procedure(s) Performed: INTRAMEDULLARY (IM) RETROGRADE FEMORAL NAILING (Right )     Patient location during evaluation: PACU Anesthesia Type: General Level of consciousness: awake and alert Pain management: pain level controlled Vital Signs Assessment: post-procedure vital signs reviewed and stable Respiratory status: spontaneous breathing, nonlabored ventilation, respiratory function stable and patient connected to nasal cannula oxygen Cardiovascular status: blood pressure returned to baseline and stable Postop Assessment: no apparent nausea or vomiting Anesthetic complications: no    Last Vitals:  Vitals:   11/30/18 1353 11/30/18 1400  BP: 122/79   Pulse: 94 (!) 103  Resp: 19 (!) 21  Temp:  36.6 C  SpO2: 95% 96%    Last Pain:  Vitals:   11/30/18 1400  TempSrc:   PainSc: 4                  Tiajuana Amass

## 2018-12-01 LAB — CBC
HCT: 34.6 % — ABNORMAL LOW (ref 39.0–52.0)
Hemoglobin: 11.5 g/dL — ABNORMAL LOW (ref 13.0–17.0)
MCH: 28.5 pg (ref 26.0–34.0)
MCHC: 33.2 g/dL (ref 30.0–36.0)
MCV: 85.6 fL (ref 80.0–100.0)
Platelets: 199 10*3/uL (ref 150–400)
RBC: 4.04 MIL/uL — ABNORMAL LOW (ref 4.22–5.81)
RDW: 13.2 % (ref 11.5–15.5)
WBC: 8.7 10*3/uL (ref 4.0–10.5)
nRBC: 0 % (ref 0.0–0.2)

## 2018-12-01 LAB — GLUCOSE, CAPILLARY
Glucose-Capillary: 320 mg/dL — ABNORMAL HIGH (ref 70–99)
Glucose-Capillary: 259 mg/dL — ABNORMAL HIGH (ref 70–99)
Glucose-Capillary: 289 mg/dL — ABNORMAL HIGH (ref 70–99)

## 2018-12-01 LAB — BASIC METABOLIC PANEL
Anion gap: 10 (ref 5–15)
BUN: 7 mg/dL (ref 6–20)
CO2: 26 mmol/L (ref 22–32)
Calcium: 8.4 mg/dL — ABNORMAL LOW (ref 8.9–10.3)
Chloride: 100 mmol/L (ref 98–111)
Creatinine, Ser: 0.85 mg/dL (ref 0.61–1.24)
GFR calc Af Amer: >60 mL/min (ref >60)
GFR calc non Af Amer: >60 mL/min (ref >60)
Glucose, Bld: 406 mg/dL — ABNORMAL HIGH (ref 70–99)
Potassium: 4.3 mmol/L (ref 3.5–5.1)
Sodium: 136 mmol/L (ref 135–145)

## 2018-12-01 LAB — PHOSPHORUS: Phosphorus: 3.3 mg/dL (ref 2.5–4.6)

## 2018-12-01 LAB — HEMOGLOBIN A1C
Hgb A1c MFr Bld: 9.9 % — ABNORMAL HIGH (ref 4.8–5.6)
Mean Plasma Glucose: 237.43 mg/dL

## 2018-12-01 MED ORDER — INSULIN ASPART 100 UNIT/ML ~~LOC~~ SOLN
0.0000 [IU] | Freq: Three times a day (TID) | SUBCUTANEOUS | Status: DC
  Administered 2018-12-01: 20 [IU] via SUBCUTANEOUS
  Administered 2018-12-01: 18:00:00 11 [IU] via SUBCUTANEOUS
  Administered 2018-12-01 – 2018-12-02 (×2): 15 [IU] via SUBCUTANEOUS
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
## 2018-12-01 NOTE — Progress Notes (Signed)
Patient's male visitor came out of room and wanted patient to be medicated for pain.  He just finished with shower.  She came back out of room and started yelling stated that I wasn't in there fast enough and had an attitude with him.  I explained to both patient and male visitor that he had the roxicodone at 1424 and couldn't get anymore of that so I was looking to see what else he could get and she said that he wasn't being treated fairly by me.  Patient also said that he would just get weed in here for his pain, I told patient that he could not smoke weed while in the hospital.  He said he didn't care.  Patient has had high blood sugars since being here and started on insulin today, attempted to educate on what he could eat and not eat and how to prevent high blood sugars and patient stated he will just go to dialysis like his mom did before he gives anything up to eat.  Patient was given 4 mg of morphine and 1000 mg of tylenol.  Let patient know that I would be back after his blood sugar was checked.

## 2018-12-01 NOTE — Progress Notes (Signed)
   Subjective:  Patient reports pain as marked over the knee incision.   Did well with PT/OT  Objective:   VITALS:   Vitals:   11/30/18 1959 12/01/18 0032 12/01/18 0451 12/01/18 0732  BP: (!) 155/94 126/72 (!) 143/99 114/66  Pulse: (!) 113 100 (!) 102 93  Resp:    16  Temp: 98.4 F (36.9 C) 98.2 F (36.8 C) 97.9 F (36.6 C) (!) 97.4 F (36.3 C)  TempSrc: Oral Oral Oral Oral  SpO2: 96% 98% 100% 99%  Weight:      Height:        Neurologically intact Neurovascular intact Sensation intact distally Intact pulses distally Dorsiflexion/Plantar flexion intact Incision: dressing C/D/I and no drainage Compartment soft   Lab Results  Component Value Date   WBC 8.7 12/01/2018   HGB 11.5 (L) 12/01/2018   HCT 34.6 (L) 12/01/2018   MCV 85.6 12/01/2018   PLT 199 12/01/2018     Assessment/Plan:  1 Day Post-Op   - Expected postop acute blood loss anemia - will monitor for symptoms - Up with PT/OT - DVT ppx - SCDs, ambulation, lovenox - WBAT operative extremity - Pain control - Discharge planning - stable for dc from ortho stand point; f/u 2 weeks in office  Eduard Roux 12/01/2018, 12:09 PM [phone]

## 2018-12-01 NOTE — Plan of Care (Signed)
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
## 2018-12-02 DIAGNOSIS — F1220 Cannabis dependence, uncomplicated: Secondary | ICD-10-CM | POA: Diagnosis present

## 2018-12-02 DIAGNOSIS — Z041 Encounter for examination and observation following transport accident: Secondary | ICD-10-CM

## 2018-12-02 DIAGNOSIS — F1010 Alcohol abuse, uncomplicated: Secondary | ICD-10-CM | POA: Diagnosis present

## 2018-12-02 DIAGNOSIS — E119 Type 2 diabetes mellitus without complications: Secondary | ICD-10-CM

## 2018-12-02 LAB — URINALYSIS, ROUTINE W REFLEX MICROSCOPIC
Bacteria, UA: NONE SEEN
Bilirubin Urine: NEGATIVE
Glucose, UA: >=500 mg/dL — AB
Hgb urine dipstick: NEGATIVE
Ketones, ur: 5 mg/dL — AB
Leukocytes,Ua: NEGATIVE
Nitrite: NEGATIVE
Protein, ur: NEGATIVE mg/dL
Specific Gravity, Urine: 1.008 (ref 1.005–1.030)
pH: 7 (ref 5.0–8.0)

## 2018-12-02 LAB — BASIC METABOLIC PANEL
Anion gap: 11 (ref 5–15)
BUN: 8 mg/dL (ref 6–20)
CO2: 27 mmol/L (ref 22–32)
Calcium: 8.5 mg/dL — ABNORMAL LOW (ref 8.9–10.3)
Chloride: 99 mmol/L (ref 98–111)
Creatinine, Ser: 0.77 mg/dL (ref 0.61–1.24)
GFR calc Af Amer: >60 mL/min (ref >60)
GFR calc non Af Amer: >60 mL/min (ref >60)
Glucose, Bld: 353 mg/dL — ABNORMAL HIGH (ref 70–99)
Potassium: 3.9 mmol/L (ref 3.5–5.1)
Sodium: 137 mmol/L (ref 135–145)

## 2018-12-02 LAB — RAPID URINE DRUG SCREEN, HOSP PERFORMED
Amphetamines: NOT DETECTED
Barbiturates: NOT DETECTED
Benzodiazepines: NOT DETECTED
Cocaine: NOT DETECTED
Opiates: POSITIVE — AB
Tetrahydrocannabinol: NOT DETECTED

## 2018-12-02 LAB — CBC
HCT: 33.3 % — ABNORMAL LOW (ref 39.0–52.0)
Hemoglobin: 11 g/dL — ABNORMAL LOW (ref 13.0–17.0)
MCH: 28.4 pg (ref 26.0–34.0)
MCHC: 33 g/dL (ref 30.0–36.0)
MCV: 86 fL (ref 80.0–100.0)
Platelets: 175 10*3/uL (ref 150–400)
RBC: 3.87 MIL/uL — ABNORMAL LOW (ref 4.22–5.81)
RDW: 13.2 % (ref 11.5–15.5)
WBC: 7.7 10*3/uL (ref 4.0–10.5)
nRBC: 0 % (ref 0.0–0.2)

## 2018-12-02 LAB — GLUCOSE, CAPILLARY
Glucose-Capillary: 314 mg/dL — ABNORMAL HIGH (ref 70–99)
Glucose-Capillary: 316 mg/dL — ABNORMAL HIGH (ref 70–99)
Glucose-Capillary: 246 mg/dL — ABNORMAL HIGH (ref 70–99)
Glucose-Capillary: 285 mg/dL — ABNORMAL HIGH (ref 70–99)

## 2018-12-02 LAB — PHOSPHORUS: Phosphorus: 3.3 mg/dL (ref 2.5–4.6)

## 2018-12-02 MED ORDER — OXYCODONE HCL 5 MG PO TABS
5.0000 mg | ORAL_TABLET | ORAL | Status: DC | PRN
  Administered 2018-12-02 – 2018-12-04 (×6): 10 mg via ORAL
  Filled 2018-12-02 (×7): qty 2

## 2018-12-02 MED ORDER — METFORMIN HCL 500 MG PO TABS
500.0000 mg | ORAL_TABLET | Freq: Every day | ORAL | Status: DC
  Administered 2018-12-03 – 2018-12-04 (×2): 500 mg via ORAL
  Filled 2018-12-02 (×2): qty 1

## 2018-12-02 MED ORDER — INSULIN ASPART 100 UNIT/ML ~~LOC~~ SOLN
0.0000 [IU] | Freq: Three times a day (TID) | SUBCUTANEOUS | Status: DC
  Administered 2018-12-02: 7 [IU] via SUBCUTANEOUS
  Administered 2018-12-02: 15 [IU] via SUBCUTANEOUS
  Administered 2018-12-03 (×2): 11 [IU] via SUBCUTANEOUS
  Administered 2018-12-03: 4 [IU] via SUBCUTANEOUS
  Administered 2018-12-04 (×2): 7 [IU] via SUBCUTANEOUS

## 2018-12-02 MED ORDER — INSULIN GLARGINE 100 UNIT/ML ~~LOC~~ SOLN
5.0000 [IU] | Freq: Every day | SUBCUTANEOUS | Status: DC
  Administered 2018-12-02 – 2018-12-04 (×3): 5 [IU] via SUBCUTANEOUS
  Filled 2018-12-02 (×3): qty 0.05

## 2018-12-02 MED ORDER — INSULIN STARTER KIT- SYRINGES (ENGLISH)
1.0000 | Freq: Once | Status: DC
  Filled 2018-12-02 (×2): qty 1

## 2018-12-02 MED ORDER — INSULIN ASPART 100 UNIT/ML ~~LOC~~ SOLN
4.0000 [IU] | Freq: Three times a day (TID) | SUBCUTANEOUS | Status: DC
  Administered 2018-12-02 – 2018-12-04 (×7): 4 [IU] via SUBCUTANEOUS

## 2018-12-02 MED ORDER — POLYETHYLENE GLYCOL 3350 17 G PO PACK
17.0000 g | PACK | Freq: Every day | ORAL | Status: DC
  Administered 2018-12-02: 17 g via ORAL
  Filled 2018-12-02 (×3): qty 1

## 2018-12-02 MED ORDER — LIVING WELL WITH DIABETES BOOK
Freq: Once | Status: AC
  Administered 2018-12-02: 12:00:00
  Filled 2018-12-02: qty 1

## 2018-12-02 NOTE — Progress Notes (Signed)
Brief Nutrition Education Note  RD consulted for nutrition education regarding diabetes.   Lab Results  Component Value Date   HGBA1C 9.9 (H) 12/01/2018   Pt receiving nursing care at time of visit. Unable to speak with pt at this time.   RD provided "Carbohydrate Counting for People with Diabetes" handout from the Academy of Nutrition and Dietetics. Handout attached to AVS/ discharge instructions.   Body mass index is 43.02 kg/m. Pt meets criteria for extreme obesity, class III based on current BMI.  Current diet order is carb modified, patient is consuming approximately 100% of meals at this time. Labs and medications reviewed. No further nutrition interventions warranted at this time. RD contact information provided. If additional nutrition issues arise, please re-consult RD.  Pistol Kessenich A. Jimmye Norman, RD, LDN, [place] Registered Dietitian II Certified Diabetes Care and Education Specialist Pager: [phone] After hours Pager: [phone]

## 2018-12-02 NOTE — Progress Notes (Signed)
Pt stable. BS 289 @ 2150. Pt asymptomatic. Will continue to monitor.
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
## 2018-12-02 NOTE — Progress Notes (Addendum)
MD- Spoke w/ pt today about his new diabetes diagnosis.  Have asked the nursing staff to begin insulin eduaction w/ pt in case decision made to d/c pt home on insulin.  Please note that patient does NOT have insurance coverage.    Will need affordable meds at time of d/c home.  1. Metformin can be purchased at [facility] for $4  2. CBG Meter and supplies can be purchased OTC at [employer] for affordable price (flyer given to pt for affordable prices)  3. If you think pt may need Insulin for discharge, may consider switching to 70/30 Insulin BID.  70/30 Insulin can be purchased for $25 per vial and insulin syringes are $12 (100 count) at [facility].  Could start with 70/30 Insulin 15 units BID with meals  This would provide pt with 21 units longer-acting insulin divided into 2 doses (based on 0.15 units/kg) and 4.5 units quick insulin BID with meals  Order Numbers for Meds at time of discharge: 1. Metformin- Order # [id number] 2. Reli-On 70/30 Insulin Vial- Order # [id number] 8. Insulin Syringes- Order # [id number]       --Will follow patient during hospitalization--  Wyn Quaker RN, MSN, CDE Diabetes Coordinator Inpatient Glycemic Control Team Team Pager: [phone] (8a-5p)
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
## 2018-12-02 NOTE — Discharge Instructions (Addendum)
weightbare as tolerates to right lower extremity  Fingerstick glucose (sugar) goals for home: Before meals: 80-130 mg/dl 2-Hours after meals: less than 180 mg/dl Hemoglobin A1c goal: 7% or less   Symptoms of Hypoglycemia (low blood sugar): Silly, Sweaty, Shaky Check sugar if you have your meter.  If near or less than 70 mg/dl, treat with 1/2 cup juice or soda or take glucose tablets Check sugar 15 minutes after treatment.  If sugar still near or less than 70 mg/al and symptomatic, treat again and may need a snack with some protein (peanut butter with crackers, etc)   Insulin Injection Instructions, Single Insulin Dose, Adult A subcutaneous injection is a shot of medicine that is injected into the layer of fat and tissue between skin and muscle. People with type 1 diabetes must take insulin because their bodies do not make it. People with type 2 diabetes may need to take insulin.  There are many different types of insulin. The type of insulin that you take may determine how many injections you give yourself and when you need to give the injections. Supplies needed:  Soap and water to wash hands.  A new, unused insulin syringe.  Your insulin medication bottle (vial).  Alcohol wipes.  A disposal container that is meant for sharp items (sharps container), such as an empty plastic bottle with a cover. How to choose a site for injection  The body absorbs insulin differently, depending on where the insulin is injected (injection site). It is best to inject insulin into the same body area each time (for example, always in the abdomen), but you should use a different spot in that area for each injection. Do not inject the insulin in the same spot each time. There are five main areas that can be used for injecting. These areas include:  Abdomen. This is the preferred area.  Front of thigh.  Upper, outer side of thigh.  Upper, outer side of arm.  Upper, outer part of buttock. How to  give a single-dose insulin injection First, follow the steps for Get ready, then continue with the steps for Push air into the vial, then follow the steps for Fill the syringe, and finish with the steps for Inject the insulin. Get ready 1. Wash your hands with soap and water. If soap and water are not available, use hand sanitizer. 2. Before you give yourself an insulin injection, be sure to test your blood sugar level (blood glucose level) and write down that number. Follow any instructions from your health care provider about what to do if your blood glucose level is higher or lower than your normal range. 3. Use a new, unused insulin syringe each time you need to inject insulin. 4. Check to make sure you have the correct type of insulin syringe for the concentration of insulin that you are using. 5. Check the expiration date and the type of insulin that you are using. 6. If you are using CLEAR insulin, check to see that it is clear and free of clumps. 7. Do not shake the vial to get it ready. Gently roll the vial between your palms several times. 8. Remove the plastic pop-top covering from the vial of insulin. This type of covering is present on a vial when it is new. 9. Use an alcohol wipe to clean the rubber top of the vial. 10. Remove the plastic cover from the syringe needle. Do not let the needle touch anything. Push air into the vial 1. To  bring (draw up) air into the syringe, slowly pull back on the syringe plunger. Stop pulling the plunger when the dose indicator gets to the number of units that you will be using. 2. While you keep the vial right-side-up, poke the needle through the rubber top of the vial. Do not turn the vial upside down to do this. 3. Push the plunger all the way into the syringe. Doing that will push air into the vial. 4. Do not take the needle out of the vial yet. Fill the syringe   1. While the needle is still in the vial, turn the vial upside down and hold it  at eye level. 2. Slowly pull back on the plunger. Stop pulling the plunger when the dose indicator gets to the desired number of units. 3. If you see air bubbles in the syringe, slowly move the plunger up and down 2 or 3 times to make them go away. ? If you had to move the plunger to get rid of air bubbles, pull back the plunger until the dose indicator returns to the correct dose. 4. Remove the needle from the vial. Do not let the needle touch anything. Inject the insulin   1. Use an alcohol wipe to clean the site where you will be injecting the needle. Let the site air-dry. 2. Hold the syringe in your writing hand like a pencil. 3. Use your other hand to pinch and hold about an inch (2.5 cm) of skin. Do not directly touch the cleaned part of the skin. 4. Gently but quickly, put the needle straight into the skin. The needle should be at a 90-degree angle (perpendicular) to the skin. 5. Push the needle in as far as it will go (to the hub). 6. When the needle is completely inserted into the skin, use your thumb or index finger of your writing hand to push the plunger all the way into the syringe to inject the insulin. 7. Let go of the skin that you are pinching. Continue to hold the syringe in place with your writing hand. 8. Wait 10 seconds, then pull the needle straight out of the skin. This will allow all of the insulin to go from the syringe and needle into your body. 9. Press and hold the alcohol wipe over the injection site until any bleeding stops. Do not rub the area. 10. Do not put the plastic cover back on the needle. 11. Discard the syringe and needle directly into a sharps container, such as an empty plastic bottle with a cover. How to throw away supplies  Discard all used needles in a puncture-proof sharps disposal container. You can ask your local pharmacy about where you can get this kind of disposal container, or you can use an empty plastic liquid laundry detergent bottle that  has a cover.  Follow the disposal regulations for the area where you live. Do not use any syringe or needle more than one time.  Throw away empty vials in the regular trash. Questions to ask your health care provider  How often should I be taking insulin?  How often should I check my blood glucose?  What amount of insulin should I be taking at each time?  What are the side effects?  What should I do if my blood glucose is too high?  What should I do if my blood glucose is too low?  What should I do if I forget to take my insulin?  What number should I  call if I have questions? Where to find more information  American Diabetes Association (ADA): www.diabetes.org  American Association of Diabetes Educators (AADE) Patient Resources: https://www.diabeteseducator.org Summary  A subcutaneous injection is a shot of medicine that is injected into the layer of fat and tissue between skin and muscle.  Before you give yourself an insulin injection, be sure to test your blood sugar level (blood glucose level) and write down that number.  The type of insulin that you take may determine how many injections you give yourself and when you need to give the injections.  Check the expiration date and the type of insulin that you are using.  It is best to inject insulin into the same body area each time (for example, always in the abdomen), but you should use a different spot in that area for each injection. This information is not intended to replace advice given to you by your health care provider. Make sure you discuss any questions you have with your health care provider. Document Released: 01/25/2015 Document Revised: 12/25/2016 Document Reviewed: 01/25/2015 Elsevier Patient Education  2020 [employer].   Carbohydrate Counting For People With Diabetes  Why Is Carbohydrate Counting Important?  Counting carbohydrate servings may help you control your blood glucose level so that you  feel better.   The balance between the carbohydrates you eat and insulin determines what your blood glucose level will be after eating.   Carbohydrate counting can also help you plan your meals. Which Foods Have Carbohydrates? Foods with carbohydrates include:  Breads, crackers, and cereals   Pasta, rice, and grains   Starchy vegetables, such as potatoes, corn, and peas   Beans and legumes   Milk, soy milk, and yogurt   Fruits and fruit juices   Sweets, such as cakes, cookies, ice cream, jam, and jelly Carbohydrate Servings In diabetes meal planning, 1 serving of a food with carbohydrate has about 15 grams of carbohydrate:  Check serving sizes with measuring cups and spoons or a food scale.   Read the Nutrition Facts on food labels to find out how many grams of carbohydrate are in foods you eat. The food lists in this handout show portions that have about 15 grams of carbohydrate.  Tips Meal Planning Tips  An Eating Plan tells you how many carbohydrate servings to eat at your meals and snacks. For many adults, eating 3 to 5 servings of carbohydrate foods at each meal and 1 or 2 carbohydrate servings for each snack works well.   In a healthy daily Eating Plan, most carbohydrates come from:   At least 6 servings of fruits and nonstarchy vegetables   At least 6 servings of grains, beans, and starchy vegetables, with at least 3 servings from whole grains   At least 2 servings of milk or milk products  Check your blood glucose level regularly. It can tell you if you need to adjust when you eat carbohydrates.   Eating foods that have fiber, such as whole grains, and having very few salty foods is good for your health.   Eat 4 to 6 ounces of meat or other protein foods (such as soybean burgers) each day. Choose low-fat sources of protein, such as lean beef, lean pork, chicken, fish, low-fat cheese, or vegetarian foods such as soy.   Eat some healthy fats, such as olive oil,  canola oil, and nuts.   Eat very little saturated fats. These unhealthy fats are found in butter, cream, and high-fat meats, such as  bacon and sausage.   Eat very little or no trans fats. These unhealthy fats are found in all foods that list partially hydrogenated oil as an ingredient.  Label Reading Tips The Nutrition Facts panel on a label lists the grams of total carbohydrate in 1 standard serving. The label's standard serving may be larger or smaller than 1 carbohydrate serving. To figure out how many carbohydrate servings are in the food:  First, look at the label's standard serving size.   Check the grams of total carbohydrate. This is the amount of carbohydrate in 1 standard serving.   Divide the grams of total carbohydrate by 15. This number equals the number of carbohydrate servings in 1 standard serving. Remember: 1 carbohydrate serving is 15 grams of carbohydrate.   Note: You may ignore the grams of sugars on the Nutrition Facts panel because they are included in the grams of total carbohydrate.  Foods Recommended 1 serving = about 15 grams of carbohydrate Starches  1 slice bread (1 ounce)   1 tortilla (6-inch size)    large bagel (1 ounce)   2 taco shells (5-inch size)    hamburger or hot dog bun ( ounce)    cup ready-to-eat unsweetened cereal    cup cooked cereal   1 cup broth-based soup   4 to 6 small crackers   1/3 cup pasta or rice (cooked)    cup beans, peas, corn, sweet potatoes, winter squash, or mashed or boiled potatoes (cooked)    large baked potato (3 ounces)    ounce pretzels, potato chips, or tortilla chips   3 cups popcorn (popped) Fruit  1 small fresh fruit ( to 1 cup)    cup canned or frozen fruit   2 tablespoons dried fruit (blueberries, cherries, cranberries, mixed fruit, raisins)   17 small grapes (3 ounces)   1 cup melon or berries    cup unsweetened fruit juice Milk  1 cup fat-free or reduced-fat milk   1  cup soy milk   2/3 cup (6 ounces) nonfat yogurt sweetened with sugar-free sweetener Sweets and Desserts  2-inch square cake (unfrosted)   2 small cookies (2/3 ounce)    cup ice cream or frozen yogurt    cup sherbet or sorbet   1 tablespoon syrup, jam, jelly, table sugar, or honey   2 tablespoons light syrup Other Foods  Count 1 cup raw vegetables or  cup cooked nonstarchy vegetables as zero (0) carbohydrate servings or free foods. If you eat 3 or more servings at one meal, count them as 1 carbohydrate serving.   Foods that have less than 20 calories in each serving also may be counted as zero carbohydrate servings or free foods.   Count 1 cup of casserole or other mixed foods as 2 carbohydrate servings.  Carbohydrate Counting for People with Diabetes Sample 1-Day Menu  Breakfast 1 extra-small banana (1 carbohydrate serving)  1 cup low-fat or fat-free milk (1 carbohydrate serving)  1 slice whole wheat bread (1 carbohydrate serving)  1 teaspoon margarine  Lunch 2 ounces [place] slices  2 slices whole wheat bread (2 carbohydrate servings)  2 lettuce leaves  4 celery sticks  4 carrot sticks  1 medium apple (1 carbohydrate serving)  1 cup low-fat or fat-free milk (1 carbohydrate serving)  Afternoon Snack 2 tablespoons raisins (1 carbohydrate serving)  3/4 ounce unsalted mini pretzels (1 carbohydrate serving)  Evening Meal 3 ounces lean roast beef  1/2 large baked potato (2 carbohydrate servings)  1 tablespoon reduced-fat sour cream  1/2 cup green beans  1 tablespoon light salad dressing  1 whole wheat dinner roll (1 carbohydrate serving)  1 teaspoon margarine  1 cup melon balls (1 carbohydrate serving)  Evening Snack 2 tablespoons unsalted nuts   Carbohydrate Counting for People with Diabetes Vegan Sample 1-Day Menu  Breakfast 1 cup cooked oatmeal (2 carbohydrate servings)   cup blueberries (1 carbohydrate serving)  2 tablespoons flaxseeds  1 cup soymilk  fortified with calcium and vitamin D  1 cup coffee  Lunch 2 slices whole wheat bread (2 carbohydrate servings)   cup baked tofu   cup lettuce  2 slices tomato  2 slices avocado   cup baby carrots  1 orange (1 carbohydrate serving)  1 cup soymilk fortified with calcium and vitamin D   Evening Meal Burrito made with: 1 6-inch corn tortilla (1 carbohydrate serving)  1 cup refried vegetarian beans (1 carbohydrate serving)   cup chopped tomatoes   cup lettuce   cup salsa  1/3 cup brown rice (1 carbohydrate serving)  1 tablespoon olive oil for rice   cup zucchini   Evening Snack 6 small whole grain crackers (1 carbohydrate serving)  2 apricots ( carbohydrate serving)   cup unsalted peanuts ( carbohydrate serving)     Carbohydrate Counting for People with Diabetes Vegetarian (Lacto-Ovo) Sample 1-Day Menu  Breakfast 1 cup cooked oatmeal (2 carbohydrate servings)   cup blueberries (1 carbohydrate serving)  2 tablespoons flaxseeds  1 egg  1 cup 1% milk (1 carbohydrate serving)  1 cup coffee  Lunch 2 slices whole wheat bread (2 carbohydrate servings)  2 ounces low-fat cheese   cup lettuce  2 slices tomato  2 slices avocado   cup baby carrots  1 orange (1 carbohydrate serving)  1 cup unsweetened tea  Evening Meal Burrito made with: 1 6-inch corn tortilla (1 carbohydrate serving)   cup refried vegetarian beans (1 carbohydrate serving)   cup tomatoes   cup lettuce   cup salsa  1/3 cup brown rice (1 carbohydrate serving)  1 tablespoon olive oil for rice   cup zucchini  1 cup 1% milk (1 carbohydrate serving)  Evening Snack 6 small whole grain crackers (1 carbohydrate serving)  2 apricots ( carbohydrate serving)   cup unsalted peanuts ( carbohydrate serving)    Copyright 2020  Academy of Nutrition and Dietetics. All rights reserved.  Using Nutrition Labels: Carbohydrate   Serving Size   Look at the serving size. All the information on the label is based  on this portion.  Servings Per Container   The number of servings contained in the package.  Guidelines for Carbohydrate   Look at the total grams of carbohydrate in the serving size.   1 carbohydrate choice = 15 grams of carbohydrate. Range of Carbohydrate Grams Per Choice  Carbohydrate Grams/Choice Carbohydrate Choices  6-10   11-20 1  21-25 1  26-35 2  36-40 2  41-50 3  51-55 3  56-65 4  66-70 4  71-80 5    Copyright 2020  Academy of Nutrition and Dietetics. All rights reserved.   Nasal Fracture A fracture is a break in a bone. A nasal fracture is a broken nose. Minor breaks do not need treatment. They often heal on their own in about one month. Serious breaks may need treatment. Sometimes surgery is needed. What are the causes? This condition is usually caused by a direct hit to the nose (blunt injury). This  often occurs from:  Playing a contact sport.  Being in a car accident.  Falling.  Getting punched. What are the signs or symptoms?  Pain.  Swelling of the nose.  Bleeding from the nose.  Bruising around the nose or bruising around the eyes (black eyes).  The nose having a crooked shape. How is this treated? Treatment depends on how bad the injury is.  Minor breaks often do not need treatment.  For more serious breaks that have caused bones to move out of position, treatment may involve one of these: ? Moving the bones back into position without surgery. Your doctor may be able to do this in his or her office after you are given medicine to numb the nose area (local anesthetic). ? Surgery. If needed, this will be done after the swelling is gone. Follow these instructions at home: Activity  Return to your normal activities as told by your doctor. Ask your doctor what activities are safe for you.  Do not play contact sports for 3-4 weeks or as told by your doctor. General instructions      If told, put ice on the injured area: ? Put  ice in a plastic bag. ? Place a towel between your skin and the bag. ? Leave the ice on for 20 minutes, 2-3 times a day.  Take over-the-counter and prescription medicines only as told by your doctor.  If your nose bleeds, sit up while you gently squeeze your nose shut for 10 minutes.  Try to not blow your nose.  Keep all follow-up visits as told by your doctor. This is important. Contact a doctor if:  You have more pain or very bad pain.  You keep having nosebleeds.  The shape of your nose does not return to normal after 5 days.  You have pus coming out of your nose. Get help right away if:  Your nose bleeds for more than 20 minutes.  You have clear fluid draining out of your nose.  You have a swelling on the inside of your nose that does not get better.  You have trouble moving your eyes.  You keep throwing up (vomiting). Summary  A nasal fracture is a broken nose.  Symptoms include pain, swelling, and bruising.  Minor breaks often do not require treatment. More serious breaks may require surgery or other treatments.  If your nose bleeds, sit up while you gently squeeze your nose shut for 10 minutes. This information is not intended to replace advice given to you by your health care provider. Make sure you discuss any questions you have with your health care provider. Document Released: 10/01/2007 Document Revised: 05/25/2017 Document Reviewed: 05/25/2017 Elsevier Patient Education  2020 [employer].
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
## 2018-12-03 LAB — GLUCOSE, CAPILLARY
Glucose-Capillary: 285 mg/dL — ABNORMAL HIGH (ref 70–99)
Glucose-Capillary: 272 mg/dL — ABNORMAL HIGH (ref 70–99)
Glucose-Capillary: 188 mg/dL — ABNORMAL HIGH (ref 70–99)
Glucose-Capillary: 229 mg/dL — ABNORMAL HIGH (ref 70–99)

## 2018-12-03 LAB — BASIC METABOLIC PANEL
Anion gap: 11 (ref 5–15)
BUN: 6 mg/dL (ref 6–20)
CO2: 29 mmol/L (ref 22–32)
Calcium: 8.8 mg/dL — ABNORMAL LOW (ref 8.9–10.3)
Chloride: 96 mmol/L — ABNORMAL LOW (ref 98–111)
Creatinine, Ser: 0.62 mg/dL (ref 0.61–1.24)
GFR calc Af Amer: >60 mL/min (ref >60)
GFR calc non Af Amer: >60 mL/min (ref >60)
Glucose, Bld: 254 mg/dL — ABNORMAL HIGH (ref 70–99)
Potassium: 4.1 mmol/L (ref 3.5–5.1)
Sodium: 136 mmol/L (ref 135–145)

## 2018-12-03 LAB — CBC
HCT: 33.5 % — ABNORMAL LOW (ref 39.0–52.0)
Hemoglobin: 11.2 g/dL — ABNORMAL LOW (ref 13.0–17.0)
MCH: 28.3 pg (ref 26.0–34.0)
MCHC: 33.4 g/dL (ref 30.0–36.0)
MCV: 84.6 fL (ref 80.0–100.0)
Platelets: 208 10*3/uL (ref 150–400)
RBC: 3.96 MIL/uL — ABNORMAL LOW (ref 4.22–5.81)
RDW: 12.9 % (ref 11.5–15.5)
WBC: 8 10*3/uL (ref 4.0–10.5)
nRBC: 0.4 % — ABNORMAL HIGH (ref 0.0–0.2)

## 2018-12-03 LAB — PHOSPHORUS: Phosphorus: 3.3 mg/dL (ref 2.5–4.6)

## 2018-12-03 MED ORDER — MORPHINE SULFATE (PF) 2 MG/ML IV SOLN: 2.0000 mg | INTRAVENOUS | Status: DC | PRN

## 2018-12-03 MED ORDER — MAGNESIUM CITRATE PO SOLN
1.0000 | Freq: Once | ORAL | Status: AC
  Administered 2018-12-03: 1 via ORAL
  Filled 2018-12-03: qty 296
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
## 2018-12-03 NOTE — Plan of Care (Signed)
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
## 2018-12-03 NOTE — Progress Notes (Signed)
Results for ANSELM, AUMILLER (MRN [id number]) as of 12/03/2018 10:14  Ref. Range 12/02/2018 06:53 12/02/2018 11:27 12/02/2018 16:00 12/02/2018 21:00 12/03/2018 06:38  Glucose-Capillary Latest Ref Range: 70 - 99 mg/dL 314 (H) 316 (H) 246 (H) 285 (H) 285 (H)  Noted that blood sugars continue to be greater than 180 mg/dl.  Recommend increasing Lantus to 12 units daily if blood sugars continue to be elevated. Titrate dosage as needed. Continue Novolog RESISTANT correction scale as ordered and Novolog 4 units TID with meals if eating at least 50% of meal.  Harvel Ricks RN BSN CDE Diabetes Coordinator Pager: [phone]  8am-5pm
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
## 2018-12-03 NOTE — Plan of Care (Signed)

## 2018-12-04 LAB — GLUCOSE, CAPILLARY
Glucose-Capillary: 215 mg/dL — ABNORMAL HIGH (ref 70–99)
Glucose-Capillary: 224 mg/dL — ABNORMAL HIGH (ref 70–99)

## 2018-12-04 MED ORDER — METFORMIN HCL ER (OSM) 500 MG PO TB24: 500.0000 mg | ORAL_TABLET | Freq: Every day | ORAL | 1 refills | Status: DC

## 2018-12-04 MED ORDER — ACETAMINOPHEN 325 MG PO TABS: 650.0000 mg | ORAL_TABLET | Freq: Four times a day (QID) | ORAL | Status: DC

## 2018-12-04 MED ORDER — BLOOD GLUCOSE METER KIT: PACK | 0 refills | Status: DC

## 2018-12-04 MED ORDER — OXYCODONE HCL 10 MG PO TABS: 5.0000 mg | ORAL_TABLET | ORAL | 0 refills | Status: DC | PRN

## 2018-12-04 MED ORDER — NOVOLIN 70/30 RELION (70-30) 100 UNIT/ML ~~LOC~~ SUSP: 15.0000 [IU] | Freq: Two times a day (BID) | SUBCUTANEOUS | 1 refills | Status: DC

## 2018-12-04 MED ORDER — METHOCARBAMOL 500 MG PO TABS: 1000.0000 mg | ORAL_TABLET | Freq: Three times a day (TID) | ORAL | 0 refills | Status: DC | PRN

## 2018-12-04 MED ORDER — "INSULIN SYRINGE-NEEDLE U-100 30G X 5/16"" 0.5 ML MISC": 1 refills | Status: DC

## 2018-12-04 NOTE — Plan of Care (Addendum)
Called by general surgery for diabetes medications. Followed recommendations made by DM coordinator on 12/02/2018. Script for Metformin, 70/30, insulin syringes, etc, printed. Patient should take these scripts to [place].

## 2018-12-04 NOTE — TOC Transition Note (Signed)
Transition of Care [facility]) - CM/SW Discharge Note   Patient Details  Name: Jeremy Salas MRN: [id number] Date of Birth: June 16, 1990  Transition of Care [facility]) CM/SW Contact:  Claudie Leach, RN Phone Number: [phone] 12/04/2018, 12:04 PM   Clinical Narrative:    Patient to d/c home with family/girlfriend.  Patient given [facility] letter with override for narcotic.  [facility] declined patient and no other charity HH available.  Referral placed to OP PT/OT at [facility].  DME delivered.     Final next level of care: Home/Self Care Barriers to Discharge: No Barriers Identified   Patient Goals and CMS Choice   CMS Medicare.gov Compare Post Acute Care list provided to:: Patient Choice offered to / list presented to : Patient   Discharge Plan and Services   Discharge Planning Services: CM Consult, [place] [facility], [facility], Medication Assistance Post acute choice: OP therapy        DME Arranged: 3-N-1, Walker rolling DME Agency: [employer] Date DME Agency Contacted: 12/02/18 Time DME Agency Contacted: [phone] Representative spoke with at DME Agency: [facility]
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
## 2018-12-04 NOTE — Plan of Care (Signed)
  Problem: Education: Goal: Knowledge of General Education information will improve Description: Including pain rating scale, medication(s)/side effects and non-pharmacologic comfort measures 12/04/2018 1440 by Rona Ravens, RN Outcome: Adequate for Discharge 12/04/2018 1440 by Rona Ravens, RN Outcome: Adequate for Discharge   Problem: Health Behavior/Discharge Planning: Goal: Ability to manage health-related needs will improve 12/04/2018 1440 by Rona Ravens, RN Outcome: Adequate for Discharge 12/04/2018 1440 by Rona Ravens, RN Outcome: Adequate for Discharge   Problem: Clinical Measurements: Goal: Ability to maintain clinical measurements within normal limits will improve 12/04/2018 1440 by Rona Ravens, RN Outcome: Adequate for Discharge 12/04/2018 1440 by Rona Ravens, RN Outcome: Adequate for Discharge Goal: Will remain free from infection 12/04/2018 1440 by Rona Ravens, RN Outcome: Adequate for Discharge 12/04/2018 1440 by Rona Ravens, RN Outcome: Adequate for Discharge Goal: Diagnostic test results will improve 12/04/2018 1440 by Rona Ravens, RN Outcome: Adequate for Discharge 12/04/2018 1440 by Rona Ravens, RN Outcome: Adequate for Discharge Goal: Respiratory complications will improve 12/04/2018 1440 by Rona Ravens, RN Outcome: Adequate for Discharge 12/04/2018 1440 by Rona Ravens, RN Outcome: Adequate for Discharge Goal: Cardiovascular complication will be avoided 12/04/2018 1440 by Rona Ravens, RN Outcome: Adequate for Discharge 12/04/2018 1440 by Rona Ravens, RN Outcome: Adequate for Discharge   Problem: Activity: Goal: Risk for activity intolerance will decrease 12/04/2018 1440 by Rona Ravens, RN Outcome: Adequate for Discharge 12/04/2018 1440 by Rona Ravens, RN Outcome: Adequate for Discharge   Problem: Nutrition: Goal: Adequate nutrition will be maintained 12/04/2018 1440 by Rona Ravens, RN Outcome: Adequate for Discharge 12/04/2018 1440 by Rona Ravens, RN Outcome:  Adequate for Discharge   Problem: Coping: Goal: Level of anxiety will decrease 12/04/2018 1440 by Rona Ravens, RN Outcome: Adequate for Discharge 12/04/2018 1440 by Rona Ravens, RN Outcome: Adequate for Discharge   Problem: Elimination: Goal: Will not experience complications related to bowel motility 12/04/2018 1440 by Rona Ravens, RN Outcome: Adequate for Discharge 12/04/2018 1440 by Rona Ravens, RN Outcome: Adequate for Discharge Goal: Will not experience complications related to urinary retention 12/04/2018 1440 by Rona Ravens, RN Outcome: Adequate for Discharge 12/04/2018 1440 by Rona Ravens, RN Outcome: Adequate for Discharge   Problem: Pain Managment: Goal: General experience of comfort will improve 12/04/2018 1440 by Rona Ravens, RN Outcome: Adequate for Discharge 12/04/2018 1440 by Rona Ravens, RN Outcome: Adequate for Discharge   Problem: Safety: Goal: Ability to remain free from injury will improve 12/04/2018 1440 by Rona Ravens, RN Outcome: Adequate for Discharge 12/04/2018 1440 by Rona Ravens, RN Outcome: Adequate for Discharge   Problem: Skin Integrity: Goal: Risk for impaired skin integrity will decrease 12/04/2018 1440 by Rona Ravens, RN Outcome: Adequate for Discharge 12/04/2018 1440 by Rona Ravens, RN Outcome: Adequate for Discharge

## 2018-12-05 ENCOUNTER — Encounter: Payer: Self-pay | Admitting: Emergency Medicine

## 2018-12-05 ENCOUNTER — Encounter (HOSPITAL_COMMUNITY): Payer: Self-pay | Admitting: Orthopaedic Surgery

## 2018-12-16 ENCOUNTER — Encounter: Payer: Self-pay | Admitting: Orthopaedic Surgery

## 2018-12-16 ENCOUNTER — Ambulatory Visit (INDEPENDENT_AMBULATORY_CARE_PROVIDER_SITE_OTHER): Payer: Self-pay | Admitting: Orthopaedic Surgery

## 2018-12-16 ENCOUNTER — Other Ambulatory Visit: Payer: Self-pay

## 2018-12-16 ENCOUNTER — Ambulatory Visit (INDEPENDENT_AMBULATORY_CARE_PROVIDER_SITE_OTHER): Payer: Self-pay

## 2018-12-16 DIAGNOSIS — M79604 Pain in right leg: Secondary | ICD-10-CM

## 2018-12-16 DIAGNOSIS — S72321A Displaced transverse fracture of shaft of right femur, initial encounter for closed fracture: Secondary | ICD-10-CM

## 2018-12-16 MED ORDER — CALCIUM CARBONATE-VITAMIN D 500-200 MG-UNIT PO TABS: 1.0000 | ORAL_TABLET | Freq: Three times a day (TID) | ORAL | 6 refills | Status: DC

## 2018-12-16 MED ORDER — HYDROCODONE-ACETAMINOPHEN 5-325 MG PO TABS: 1.0000 | ORAL_TABLET | Freq: Two times a day (BID) | ORAL | 0 refills | Status: DC | PRN

## 2018-12-16 MED ORDER — ZINC SULFATE 220 (50 ZN) MG PO CAPS: 220.0000 mg | ORAL_CAPSULE | Freq: Every day | ORAL | 0 refills | Status: DC
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
## 2019-01-13 ENCOUNTER — Ambulatory Visit: Payer: Self-pay | Admitting: Orthopaedic Surgery

## 2019-01-18 ENCOUNTER — Other Ambulatory Visit: Payer: Self-pay

## 2019-01-18 ENCOUNTER — Ambulatory Visit: Payer: Self-pay

## 2019-01-18 ENCOUNTER — Ambulatory Visit (INDEPENDENT_AMBULATORY_CARE_PROVIDER_SITE_OTHER): Payer: Self-pay | Admitting: Orthopaedic Surgery

## 2019-01-18 DIAGNOSIS — S72321A Displaced transverse fracture of shaft of right femur, initial encounter for closed fracture: Secondary | ICD-10-CM

## 2019-01-22 NOTE — Progress Notes (Signed)
Patient left.

## 2019-08-16 ENCOUNTER — Ambulatory Visit: Payer: Self-pay | Admitting: Adult Health

## 2019-09-06 ENCOUNTER — Ambulatory Visit: Payer: Self-pay | Admitting: Adult Health

## 2019-09-27 ENCOUNTER — Other Ambulatory Visit: Payer: Self-pay

## 2019-09-27 ENCOUNTER — Ambulatory Visit: Payer: Medicaid Other | Admitting: Adult Health
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
## 2020-02-13 ENCOUNTER — Ambulatory Visit: Payer: Medicaid Other | Admitting: Adult Health

## 2020-02-13 DIAGNOSIS — E119 Type 2 diabetes mellitus without complications: Secondary | ICD-10-CM

## 2020-02-13 DIAGNOSIS — Z1389 Encounter for screening for other disorder: Secondary | ICD-10-CM

## 2020-02-16 ENCOUNTER — Ambulatory Visit (INDEPENDENT_AMBULATORY_CARE_PROVIDER_SITE_OTHER): Payer: Medicaid Other | Admitting: Adult Health

## 2020-02-16 ENCOUNTER — Encounter: Payer: Self-pay | Admitting: Adult Health

## 2020-02-16 ENCOUNTER — Telehealth: Payer: Self-pay | Admitting: Adult Health

## 2020-02-16 ENCOUNTER — Other Ambulatory Visit: Payer: Self-pay

## 2020-02-16 VITALS — BP 129/70 | HR 86 | Temp 98.5°F | Resp 16 | Ht 70.0 in | Wt 305.0 lb

## 2020-02-16 DIAGNOSIS — E119 Type 2 diabetes mellitus without complications: Secondary | ICD-10-CM

## 2020-02-16 DIAGNOSIS — Z6841 Body Mass Index (BMI) 40.0 and over, adult: Secondary | ICD-10-CM

## 2020-02-16 DIAGNOSIS — M62838 Other muscle spasm: Secondary | ICD-10-CM

## 2020-02-16 MED ORDER — METFORMIN HCL ER (OSM) 500 MG PO TB24: 500.0000 mg | ORAL_TABLET | Freq: Every day | ORAL | 0 refills | Status: DC

## 2020-02-16 MED ORDER — METHOCARBAMOL 500 MG PO TABS
1000.0000 mg | ORAL_TABLET | Freq: Three times a day (TID) | ORAL | 0 refills | Status: DC | PRN
Start: 2020-02-16 — End: 2020-05-20

## 2020-02-16 NOTE — Telephone Encounter (Signed)
Okay for me to give verbal approval? KW

## 2020-02-16 NOTE — Telephone Encounter (Signed)
Yes - ok - thanks 

## 2020-02-16 NOTE — Patient Instructions (Signed)
Diabetes Mellitus and Nutrition, Adult When you have diabetes, or diabetes mellitus, it is very important to have healthy eating habits because your blood sugar (glucose) levels are greatly affected by what you eat and drink. Eating healthy foods in the right amounts, at about the same times every day, can help you:  Control your blood glucose.  Lower your risk of heart disease.  Improve your blood pressure.  Reach or maintain a healthy weight. What can affect my meal plan? Every person with diabetes is different, and each person has different needs for a meal plan. Your health care provider may recommend that you work with a dietitian to make a meal plan that is best for you. Your meal plan may vary depending on factors such as:  The calories you need.  The medicines you take.  Your weight.  Your blood glucose, blood pressure, and cholesterol levels.  Your activity level.  Other health conditions you have, such as heart or kidney disease. How do carbohydrates affect me? Carbohydrates, also called carbs, affect your blood glucose level more than any other type of food. Eating carbs naturally raises the amount of glucose in your blood. Carb counting is a method for keeping track of how many carbs you eat. Counting carbs is important to keep your blood glucose at a healthy level, especially if you use insulin or take certain oral diabetes medicines. It is important to know how many carbs you can safely have in each meal. This is different for every person. Your dietitian can help you calculate how many carbs you should have at each meal and for each snack. How does alcohol affect me? Alcohol can cause a sudden decrease in blood glucose (hypoglycemia), especially if you use insulin or take certain oral diabetes medicines. Hypoglycemia can be a life-threatening condition. Symptoms of hypoglycemia, such as sleepiness, dizziness, and confusion, are similar to symptoms of having too much  alcohol.  Do not drink alcohol if: ? Your health care provider tells you not to drink. ? You are pregnant, may be pregnant, or are planning to become pregnant.  If you drink alcohol: ? Do not drink on an empty stomach. ? Limit how much you use to:  0-1 drink a day for women.  0-2 drinks a day for men. ? Be aware of how much alcohol is in your drink. In the [place] one drink equals one 12 oz bottle of beer (355 mL), one 5 oz glass of wine (148 mL), or one 1 oz glass of hard liquor (44 mL). ? Keep yourself hydrated with water, diet soda, or unsweetened iced tea.  Keep in mind that regular soda, juice, and other mixers may contain a lot of sugar and must be counted as carbs. What are tips for following this plan? Reading food labels  Start by checking the serving size on the "Nutrition Facts" label of packaged foods and drinks. The amount of calories, carbs, fats, and other nutrients listed on the label is based on one serving of the item. Many items contain more than one serving per package.  Check the total grams (g) of carbs in one serving. You can calculate the number of servings of carbs in one serving by dividing the total carbs by 15. For example, if a food has 30 g of total carbs per serving, it would be equal to 2 servings of carbs.  Check the number of grams (g) of saturated fats and trans fats in one serving. Choose foods that have   a low amount or none of these fats.  Check the number of milligrams (mg) of salt (sodium) in one serving. Most people should limit total sodium intake to less than 2,300 mg per day.  Always check the nutrition information of foods labeled as "low-fat" or "nonfat." These foods may be higher in added sugar or refined carbs and should be avoided.  Talk to your dietitian to identify your daily goals for nutrients listed on the label. Shopping  Avoid buying canned, pre-made, or processed foods. These foods tend to be high in fat, sodium, and added  sugar.  Shop around the outside edge of the grocery store. This is where you will most often find fresh fruits and vegetables, bulk grains, fresh meats, and fresh dairy. Cooking  Use low-heat cooking methods, such as baking, instead of high-heat cooking methods like deep frying.  Cook using healthy oils, such as olive, canola, or sunflower oil.  Avoid cooking with butter, cream, or high-fat meats. Meal planning  Eat meals and snacks regularly, preferably at the same times every day. Avoid going long periods of time without eating.  Eat foods that are high in fiber, such as fresh fruits, vegetables, beans, and whole grains. Talk with your dietitian about how many servings of carbs you can eat at each meal.  Eat 4-6 oz (112-168 g) of lean protein each day, such as lean meat, chicken, fish, eggs, or tofu. One ounce (oz) of lean protein is equal to: ? 1 oz (28 g) of meat, chicken, or fish. ? 1 egg. ?  cup (62 g) of tofu.  Eat some foods each day that contain healthy fats, such as avocado, nuts, seeds, and fish.   What foods should I eat? Fruits Berries. Apples. Oranges. Peaches. Apricots. Plums. Grapes. Mango. Papaya. Pomegranate. Kiwi. Cherries. Vegetables Lettuce. Spinach. Leafy greens, including kale, chard, collard greens, and mustard greens. Beets. Cauliflower. Cabbage. Broccoli. Carrots. Green beans. Tomatoes. Peppers. Onions. Cucumbers. Brussels sprouts. Grains Whole grains, such as whole-wheat or whole-grain bread, crackers, tortillas, cereal, and pasta. Unsweetened oatmeal. Quinoa. Brown or wild rice. Meats and other proteins Seafood. Poultry without skin. Lean cuts of poultry and beef. Tofu. Nuts. Seeds. Dairy Low-fat or fat-free dairy products such as milk, yogurt, and cheese. The items listed above may not be a complete list of foods and beverages you can eat. Contact a dietitian for more information. What foods should I avoid? Fruits Fruits canned with  syrup. Vegetables Canned vegetables. Frozen vegetables with butter or cream sauce. Grains Refined white flour and flour products such as bread, pasta, snack foods, and cereals. Avoid all processed foods. Meats and other proteins Fatty cuts of meat. Poultry with skin. Breaded or fried meats. Processed meat. Avoid saturated fats. Dairy Full-fat yogurt, cheese, or milk. Beverages Sweetened drinks, such as soda or iced tea. The items listed above may not be a complete list of foods and beverages you should avoid. Contact a dietitian for more information. Questions to ask a health care provider  Do I need to meet with a diabetes educator?  Do I need to meet with a dietitian?  What number can I call if I have questions?  When are the best times to check my blood glucose? Where to find more information:  American Diabetes Association: diabetes.org  Academy of Nutrition and Dietetics: www.eatright.org  National Institute of Diabetes and Digestive and Kidney Diseases: www.niddk.nih.gov  Association of Diabetes Care and Education Specialists: www.diabeteseducator.org Summary  It is important to have healthy eating   habits because your blood sugar (glucose) levels are greatly affected by what you eat and drink.  A healthy meal plan will help you control your blood glucose and maintain a healthy lifestyle.  Your health care provider may recommend that you work with a dietitian to make a meal plan that is best for you.  Keep in mind that carbohydrates (carbs) and alcohol have immediate effects on your blood glucose levels. It is important to count carbs and to use alcohol carefully. This information is not intended to replace advice given to you by your health care provider. Make sure you discuss any questions you have with your health care provider. Document Revised: 11/29/2018 Document Reviewed: 11/29/2018 Elsevier Patient Education  2021 [employer]  
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
## 2020-02-16 NOTE — Telephone Encounter (Signed)
Gave verbal on phone to change Rx. KW

## 2020-02-16 NOTE — Telephone Encounter (Signed)
Pharmacy called and stated that the form of Metformin sent today was more expensive and they wanted a verbal to change it to regular metformin extended release / please advise

## 2020-02-17 ENCOUNTER — Other Ambulatory Visit: Payer: Self-pay | Admitting: Adult Health

## 2020-02-17 DIAGNOSIS — M62838 Other muscle spasm: Secondary | ICD-10-CM

## 2020-02-19 ENCOUNTER — Encounter: Payer: Self-pay | Admitting: Adult Health

## 2020-05-12 ENCOUNTER — Other Ambulatory Visit: Payer: Self-pay | Admitting: Adult Health

## 2020-05-12 DIAGNOSIS — E119 Type 2 diabetes mellitus without complications: Secondary | ICD-10-CM

## 2020-05-13 NOTE — Telephone Encounter (Signed)
   Notes to clinic:  Patient has appt on 05/05/2020 Review for refills   Requested Prescriptions  Pending Prescriptions Disp Refills   metFORMIN (GLUCOPHAGE-XR) 500 MG 24 hr tablet [Pharmacy Med Name: METFORMIN HCL XR 500 MG TABLET] 90 tablet     Sig: TAKE 1 TABLET (500 MG TOTAL) BY MOUTH DAILY WITH BREAKFAST.      Endocrinology:  Diabetes - Biguanides Failed - 05/12/2020  6:52 AM      Failed - Cr in normal range and within 360 days    Creatinine, Ser  Date Value Ref Range Status  12/03/2018 0.62 0.61 - 1.24 mg/dL Final          Failed - HBA1C is between 0 and 7.9 and within 180 days    Hgb A1c MFr Bld  Date Value Ref Range Status  12/01/2018 9.9 (H) 4.8 - 5.6 % Final    Comment:    (NOTE) Pre diabetes:          5.7%-6.4% Diabetes:              >6.4% Glycemic control for   <7.0% adults with diabetes           Failed - eGFR in normal range and within 360 days    GFR calc Af Amer  Date Value Ref Range Status  12/03/2018 >60 >60 mL/min Final   GFR calc non Af Amer  Date Value Ref Range Status  12/03/2018 >60 >60 mL/min Final          Passed - Valid encounter within last 6 months    Recent Outpatient Visits           2 months ago Diabetes mellitus without complication [facility])   [place] Flinchum, Kelby Aline, FNP       Future Appointments             In 2 days Jerrol Banana., MD [facility], [place]

## 2020-05-15 ENCOUNTER — Ambulatory Visit (INDEPENDENT_AMBULATORY_CARE_PROVIDER_SITE_OTHER): Payer: Self-pay | Admitting: Family Medicine

## 2020-05-15 ENCOUNTER — Other Ambulatory Visit: Payer: Self-pay

## 2020-05-15 ENCOUNTER — Ambulatory Visit: Payer: Self-pay | Admitting: Adult Health

## 2020-05-15 ENCOUNTER — Other Ambulatory Visit: Payer: Self-pay | Admitting: Adult Health

## 2020-05-15 ENCOUNTER — Encounter: Payer: Self-pay | Admitting: Family Medicine

## 2020-05-15 VITALS — BP 134/98 | HR 79 | Resp 16 | Wt 297.0 lb

## 2020-05-15 DIAGNOSIS — G629 Polyneuropathy, unspecified: Secondary | ICD-10-CM

## 2020-05-15 DIAGNOSIS — F1220 Cannabis dependence, uncomplicated: Secondary | ICD-10-CM

## 2020-05-15 DIAGNOSIS — E66813 Obesity, class 3: Secondary | ICD-10-CM

## 2020-05-15 DIAGNOSIS — M62838 Other muscle spasm: Secondary | ICD-10-CM

## 2020-05-15 DIAGNOSIS — R202 Paresthesia of skin: Secondary | ICD-10-CM

## 2020-05-15 DIAGNOSIS — E119 Type 2 diabetes mellitus without complications: Secondary | ICD-10-CM

## 2020-05-15 DIAGNOSIS — J301 Allergic rhinitis due to pollen: Secondary | ICD-10-CM

## 2020-05-15 MED ORDER — BLOOD GLUCOSE METER KIT: PACK | 0 refills | Status: AC

## 2020-05-15 NOTE — Telephone Encounter (Signed)
Requested medication (s) are due for refill today:no  Requested medication (s) are on the active medication list: yes  Last refill: 02/16/2020  Future visit scheduled: no  Notes to clinic:  this refill cannot be delegated    Requested Prescriptions  Pending Prescriptions Disp Refills   methocarbamol (ROBAXIN) 500 MG tablet [Pharmacy Med Name: METHOCARBAMOL 500 MG TABLET] 30 tablet 0    Sig: TAKE TWO TABLETS BY MOUTH EVERY 8 HOURS AS NEEDED FOR MUSCLE SPASMS      Not Delegated - Analgesics:  Muscle Relaxants Failed - 05/15/2020  2:27 PM      Failed - This refill cannot be delegated      Passed - Valid encounter within last 6 months    Recent Outpatient Visits           Today Diabetes mellitus without complication [facility])   [facility] [place]., MD   2 months ago Diabetes mellitus without complication [facility])   [facility] Flinchum, Eula Fried, FNP

## 2020-05-15 NOTE — Progress Notes (Signed)
I,Jeremy Salas,acting as a scribe for [name], MD.,have documented all relevant documentation on the behalf of [name], MD,as directed by  [name], MD while in the presence of [name], MD.   Established patient visit   Patient: Jeremy Salas   DOB: [birth year]   30 y.o. Male  MRN: [id number] Visit Date: 05/15/2020  Today's healthcare provider: [name], MD   Chief Complaint  Patient presents with  . Follow-up  . Diabetes   Subjective    HPI  Patient comes in today for follow-up.  He does not check his blood sugars at all.  Complains of chronic allergies chronic ear pain and tingling and numbness in his feet.  He has no exercise and does not watch his diet. Diabetes Mellitus Type II, follow-up  Lab Results  Component Value Date   HGBA1C 9.9 (H) 12/01/2018   Last seen for diabetes 3 months ago.  Management since then includes; seen by Laverna Peace.  He reports good compliance with treatment. He is having side effects. Feet tingling and numb intermittently.   Home blood sugar records: fasting range: not checking  Episodes of hypoglycemia? No none   Current insulin regiment: Novolin Most Recent Eye Exam: none  --------------------------------------------------------------------       Medications: Outpatient Medications Prior to Visit  Medication Sig  . metFORMIN (GLUCOPHAGE-XR) 500 MG 24 hr tablet TAKE 1 TABLET (500 MG TOTAL) BY MOUTH DAILY WITH BREAKFAST.  . methocarbamol (ROBAXIN) 500 MG tablet Take 2 tablets (1,000 mg total) by mouth every 8 (eight) hours as needed for muscle spasms.  . calcium-vitamin D (OSCAL WITH D) 500-200 MG-UNIT tablet Take 1 tablet by mouth 3 (three) times daily. (Patient not taking: Reported on 02/16/2020)  . insulin NPH-regular Human (NOVOLIN 70/30 RELION) (70-30) 100 UNIT/ML injection Inject 15 Units into the skin 2 (two) times daily with a meal. (Patient not taking: No sig  reported)  . Insulin Syringe-Needle U-100 (RELION INSULIN SYR 0.5CC/30G) 30G X 5/16" 0.5 ML MISC Use with insulin 70/30 (Patient not taking: No sig reported)  . omeprazole (PRILOSEC OTC) 20 MG tablet Take 20 mg by mouth daily before breakfast.  (Patient not taking: No sig reported)  . [DISCONTINUED] blood glucose meter kit and supplies Dispense based on patient and insurance preference. Use up to four times daily as directed. (FOR ICD-10 E10.9, E11.9). (Patient not taking: No sig reported)   No facility-administered medications prior to visit.    Review of Systems  Constitutional: Negative for appetite change, chills and fever.  Respiratory: Negative for chest tightness, shortness of breath and wheezing.   Cardiovascular: Negative for chest pain and palpitations.  Gastrointestinal: Negative for abdominal pain, nausea and vomiting.        Objective    BP (!) 134/98 (BP Location: Right Arm, Patient Position: Sitting, Cuff Size: Large)   Pulse 79   Resp 16   Wt 297 lb (134.7 kg)   SpO2 97%   BMI 42.62 kg/m  SpO2 Readings from Last 3 Encounters:  05/15/20 97%  02/16/20 100%  12/04/18 99%       Physical Exam Vitals and nursing note reviewed.  Constitutional:      General: He is not in acute distress.    Appearance: He is well-developed. He is obese. He is not ill-appearing, toxic-appearing or diaphoretic.  HENT:     Head: Normocephalic and atraumatic.     Right Ear: Hearing, tympanic membrane, ear canal  and external ear normal.     Left Ear: Hearing, tympanic membrane, ear canal and external ear normal.     Nose: Nose normal.     Mouth/Throat:     Mouth: Mucous membranes are moist.     Pharynx: Uvula midline. No oropharyngeal exudate or posterior oropharyngeal erythema.  Eyes:     General: Lids are normal. No scleral icterus.       Right eye: No discharge.        Left eye: No discharge.     Extraocular Movements: Extraocular movements intact.     Conjunctiva/sclera:  Conjunctivae normal.     Pupils: Pupils are equal, round, and reactive to light.  Neck:     Thyroid: No thyromegaly.     Vascular: Normal carotid pulses. No carotid bruit, hepatojugular reflux or JVD.     Trachea: Trachea and phonation normal. No tracheal tenderness or tracheal deviation.     Meningeal: Brudzinski's sign absent.  Cardiovascular:     Rate and Rhythm: Normal rate and regular rhythm.     Pulses: Normal pulses.     Heart sounds: Normal heart sounds, S1 normal and S2 normal. Heart sounds not distant. No murmur heard. No friction rub. No gallop.   Pulmonary:     Effort: Pulmonary effort is normal. No accessory muscle usage or respiratory distress.     Breath sounds: Normal breath sounds. No stridor. No wheezing or rales.  Chest:     Chest wall: No tenderness.  Abdominal:     General: Bowel sounds are normal. There is no distension.     Palpations: Abdomen is soft. There is no mass.     Tenderness: There is no abdominal tenderness. There is no guarding or rebound.     Hernia: No hernia is present.  Genitourinary:    Comments: Deferred.  Musculoskeletal:        General: No tenderness or deformity.     Cervical back: Full passive range of motion without pain, normal range of motion and neck supple.     Comments: Patient moves on and off of exam table and in room without difficulty. Gait is normal in hall and in room. Patient is oriented to person place time and situation. Patient answers questions appropriately and engages in conversation.   Lymphadenopathy:     Head:     Right side of head: No submental, submandibular, tonsillar, preauricular, posterior auricular or occipital adenopathy.     Left side of head: No submental, submandibular, tonsillar, preauricular, posterior auricular or occipital adenopathy.     Cervical: No cervical adenopathy.  Skin:    General: Skin is warm and dry.     Capillary Refill: Capillary refill takes less than 2 seconds.     Coloration: Skin  is not pale.     Findings: No erythema or rash.     Nails: There is no clubbing.  Neurological:     General: No focal deficit present.     Mental Status: He is alert and oriented to person, place, and time.     GCS: GCS eye subscore is 4. GCS verbal subscore is 5. GCS motor subscore is 6.     Cranial Nerves: No cranial nerve deficit.     Sensory: No sensory deficit.     Motor: No weakness or abnormal muscle tone.     Coordination: Coordination normal.     Gait: Gait normal.     Deep Tendon Reflexes: Reflexes are normal and symmetric. Reflexes normal.  Comments: Mild decrease sensation and distal toes  Psychiatric:        Mood and Affect: Mood normal.        Speech: Speech normal.        Behavior: Behavior normal.        Thought Content: Thought content normal.        Judgment: Judgment normal.       No results found for any visits on 05/15/20.  Assessment & Plan     1. Diabetes mellitus without complication ([place]) Patient advised to check blood sugars fasting to know what is running.  He has no idea.  Patient written for glucometer and strips - Hemoglobin A1c - CBC w/Diff/Platelet - Comprehensive Metabolic Panel (CMET) - TSH - Vitamin B12 - blood glucose meter kit and supplies; Dispense based on patient and insurance preference. Use Once daily as directed. (FOR ICD-10 E11.9).  Dispense: 1 each; Refill: 0  2. Obesity, Class III, BMI 40-49.9 (morbid obesity) (HCC) Diet and exercise stressed.  BMI is 42.6 - Hemoglobin A1c - CBC w/Diff/Platelet - Comprehensive Metabolic Panel (CMET) - TSH - Vitamin B12  3. Tingling of both feet Neuropathy secondary to diabetes most likely. - Hemoglobin A1c - CBC w/Diff/Platelet - Comprehensive Metabolic Panel (CMET) - TSH - Vitamin B12  4. Neuropathy  - Hemoglobin A1c - CBC w/Diff/Platelet - Comprehensive Metabolic Panel (CMET) - TSH - Vitamin B12  5. Marijuana dependence ([place]) Patient advised to cut back  6. Seasonal  allergic rhinitis due to pollen Try over-the-counter Claritin  Return in about 3 months (around 08/15/2020).      I, [name], MD, have reviewed all documentation for this visit. The documentation on 05/18/20 for the exam, diagnosis, procedures, and orders are all accurate and complete.    Shuree Brossart Cranford Mon, MD  [facility] [phone] (phone) [phone] (fax)  [place]

## 2020-05-15 NOTE — Patient Instructions (Signed)
Check blood sugars up to 3 times a week. Try over-the-counter Claritin daily. Scheduled follow up appointment in 3 months.

## 2020-05-16 LAB — COMPREHENSIVE METABOLIC PANEL
ALT: 31 IU/L (ref 0–44)
AST: 18 IU/L (ref 0–40)
Albumin/Globulin Ratio: 1.5 (ref 1.2–2.2)
Albumin: 4.1 g/dL (ref 4.0–5.0)
Alkaline Phosphatase: 96 IU/L (ref 44–121)
BUN/Creatinine Ratio: 16 (ref 9–20)
BUN: 12 mg/dL (ref 6–20)
Bilirubin Total: 0.5 mg/dL (ref 0.0–1.2)
CO2: 24 mmol/L (ref 20–29)
Calcium: 9.7 mg/dL (ref 8.7–10.2)
Chloride: 98 mmol/L (ref 96–106)
Creatinine, Ser: 0.73 mg/dL — ABNORMAL LOW (ref 0.76–1.27)
Globulin, Total: 2.7 g/dL (ref 1.5–4.5)
Glucose: 194 mg/dL — ABNORMAL HIGH (ref 65–99)
Potassium: 4.6 mmol/L (ref 3.5–5.2)
Sodium: 140 mmol/L (ref 134–144)
Total Protein: 6.8 g/dL (ref 6.0–8.5)
eGFR: 125 mL/min/{1.73_m2} (ref >59)

## 2020-05-16 LAB — CBC WITH DIFFERENTIAL/PLATELET
Basophils Absolute: 0.1 10*3/uL (ref 0.0–0.2)
Basos: 1 %
EOS (ABSOLUTE): 0.2 10*3/uL (ref 0.0–0.4)
Eos: 2 %
Hematocrit: 45 % (ref 37.5–51.0)
Hemoglobin: 15.1 g/dL (ref 13.0–17.7)
Immature Grans (Abs): 0 10*3/uL (ref 0.0–0.1)
Immature Granulocytes: 0 %
Lymphocytes Absolute: 2.7 10*3/uL (ref 0.7–3.1)
Lymphs: 27 %
MCH: 27.9 pg (ref 26.6–33.0)
MCHC: 33.6 g/dL (ref 31.5–35.7)
MCV: 83 fL (ref 79–97)
Monocytes Absolute: 0.7 10*3/uL (ref 0.1–0.9)
Monocytes: 6 %
Neutrophils Absolute: 6.6 10*3/uL (ref 1.4–7.0)
Neutrophils: 64 %
Platelets: 283 10*3/uL (ref 150–450)
RBC: 5.41 x10E6/uL (ref 4.14–5.80)
RDW: 13.8 % (ref 11.6–15.4)
WBC: 10.3 10*3/uL (ref 3.4–10.8)

## 2020-05-16 LAB — HEMOGLOBIN A1C
Est. average glucose Bld gHb Est-mCnc: 237 mg/dL
Hgb A1c MFr Bld: 9.9 % — ABNORMAL HIGH (ref 4.8–5.6)

## 2020-05-16 LAB — TSH: TSH: 3.11 u[IU]/mL (ref 0.450–4.500)

## 2020-05-16 LAB — VITAMIN B12: Vitamin B-12: 434 pg/mL (ref 232–1245)

## 2020-06-17 ENCOUNTER — Other Ambulatory Visit: Payer: Self-pay | Admitting: Family Medicine

## 2020-06-17 DIAGNOSIS — E119 Type 2 diabetes mellitus without complications: Secondary | ICD-10-CM

## 2020-06-17 NOTE — Telephone Encounter (Signed)
[facility] faxed refill request for the following medications:  metFORMIN (GLUCOPHAGE-XR) 500 MG 24 hr tablet  Last Rx: 05/13/20 Qty: 30 Refills: 0 LOV: 05/15/20 With Dr. Sullivan Lone Please advise. Thanks TNP

## 2020-06-17 NOTE — Telephone Encounter (Signed)
Please review. Thanks!  

## 2020-06-18 MED ORDER — METFORMIN HCL ER 500 MG PO TB24: 500.0000 mg | ORAL_TABLET | Freq: Every day | ORAL | 0 refills | Status: DC

## 2020-07-16 ENCOUNTER — Other Ambulatory Visit: Payer: Self-pay | Admitting: Family Medicine

## 2020-07-16 DIAGNOSIS — E119 Type 2 diabetes mellitus without complications: Secondary | ICD-10-CM

## 2020-08-14 ENCOUNTER — Other Ambulatory Visit: Payer: Self-pay | Admitting: Family Medicine

## 2020-08-14 DIAGNOSIS — E119 Type 2 diabetes mellitus without complications: Secondary | ICD-10-CM

## 2020-08-14 MED ORDER — METFORMIN HCL ER 500 MG PO TB24: 500.0000 mg | ORAL_TABLET | Freq: Every day | ORAL | 0 refills | Status: DC

## 2020-08-14 NOTE — Telephone Encounter (Signed)
Pt called back and scheduled appt for 08/19/2020

## 2020-08-14 NOTE — Addendum Note (Signed)
Addended by: Lannie Fields on: 08/14/2020 01:56 PM   Modules accepted: Orders

## 2020-08-14 NOTE — Telephone Encounter (Signed)
Notes to clinic:  Patient scheduled appt for 08/19/2020 Review for 5 day supply    Requested Prescriptions  Pending Prescriptions Disp Refills   metFORMIN (GLUCOPHAGE-XR) 500 MG 24 hr tablet 30 tablet 0    Sig: Take 1 tablet (500 mg total) by mouth daily with breakfast.      Endocrinology:  Diabetes - Biguanides Failed - 08/14/2020  1:56 PM      Failed - Cr in normal range and within 360 days    Creatinine, Ser  Date Value Ref Range Status  05/15/2020 0.73 (L) 0.76 - 1.27 mg/dL Final          Failed - HBA1C is between 0 and 7.9 and within 180 days    Hgb A1c MFr Bld  Date Value Ref Range Status  05/15/2020 9.9 (H) 4.8 - 5.6 % Final    Comment:             Prediabetes: 5.7 - 6.4          Diabetes: >6.4          Glycemic control for adults with diabetes: <7.0           Passed - eGFR in normal range and within 360 days    GFR calc Af Amer  Date Value Ref Range Status  12/03/2018 >60 >60 mL/min Final   GFR calc non Af Amer  Date Value Ref Range Status  12/03/2018 >60 >60 mL/min Final   eGFR  Date Value Ref Range Status  05/15/2020 125 >59 mL/min/1.73 Final          Passed - Valid encounter within last 6 months    Recent Outpatient Visits           3 months ago Diabetes mellitus without complication [facility])   [facility] Jerrol Banana., MD   6 months ago Diabetes mellitus without complication [facility])   [place], Kelby Aline, FNP       Future Appointments             In 5 days Bacigalupo, Dionne Bucy, MD [facility], [facility]              Refused Prescriptions Disp Refills   metFORMIN (GLUCOPHAGE-XR) 500 MG 24 hr tablet [Pharmacy Med Name: METFORMIN HCL XR 500 MG TABLET] 30 tablet 0    Sig: TAKE ONE TABLET BY MOUTH DAILY WITH BREAKFAST      Endocrinology:  Diabetes - Biguanides Failed - 08/14/2020  1:56 PM      Failed - Cr in normal range and within 360 days    Creatinine, Ser  Date Value Ref  Range Status  05/15/2020 0.73 (L) 0.76 - 1.27 mg/dL Final          Failed - HBA1C is between 0 and 7.9 and within 180 days    Hgb A1c MFr Bld  Date Value Ref Range Status  05/15/2020 9.9 (H) 4.8 - 5.6 % Final    Comment:             Prediabetes: 5.7 - 6.4          Diabetes: >6.4          Glycemic control for adults with diabetes: <7.0           Passed - eGFR in normal range and within 360 days    GFR calc Af Amer  Date Value Ref Range Status  12/03/2018 >60 >60 mL/min Final  GFR calc non Af Amer  Date Value Ref Range Status  12/03/2018 >60 >60 mL/min Final   eGFR  Date Value Ref Range Status  05/15/2020 125 >59 mL/min/1.73 Final          Passed - Valid encounter within last 6 months    Recent Outpatient Visits           3 months ago Diabetes mellitus without complication [facility])   [facility] Jerrol Banana., MD   6 months ago Diabetes mellitus without complication [facility])   [place] Flinchum, Kelby Aline, FNP       Future Appointments             In 5 days Bacigalupo, Dionne Bucy, MD [facility], [place]

## 2020-08-14 NOTE — Telephone Encounter (Signed)
Requested medications are due for refill today.  yes  Requested medications are on the active medications list.  yes  Last refill. 07/16/2020  Future visit scheduled.   no  Notes to clinic.  Michelle Flinchum listed as PCP.

## 2020-08-19 ENCOUNTER — Other Ambulatory Visit: Payer: Self-pay

## 2020-08-19 ENCOUNTER — Ambulatory Visit: Payer: Self-pay | Admitting: Family Medicine
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
## 2020-09-14 ENCOUNTER — Other Ambulatory Visit: Payer: Self-pay | Admitting: Family Medicine

## 2020-09-14 DIAGNOSIS — E119 Type 2 diabetes mellitus without complications: Secondary | ICD-10-CM

## 2020-09-14 NOTE — Telephone Encounter (Signed)
Requested medications are due for refill today.  yes  Requested medications are on the active medications list.  yes  Last refill. 02/29/2020  Future visit scheduled.   no  Notes to clinic.  PCP listed as Ms. Flinchum.

## 2020-09-19 ENCOUNTER — Other Ambulatory Visit: Payer: Self-pay

## 2020-09-19 ENCOUNTER — Encounter: Payer: Self-pay | Admitting: Family Medicine

## 2020-09-19 ENCOUNTER — Ambulatory Visit (INDEPENDENT_AMBULATORY_CARE_PROVIDER_SITE_OTHER): Payer: Self-pay | Admitting: Family Medicine

## 2020-09-19 VITALS — BP 132/87 | HR 86 | Temp 97.9°F | Resp 16 | Ht 70.0 in | Wt 299.0 lb

## 2020-09-19 DIAGNOSIS — F1220 Cannabis dependence, uncomplicated: Secondary | ICD-10-CM

## 2020-09-19 DIAGNOSIS — E119 Type 2 diabetes mellitus without complications: Secondary | ICD-10-CM

## 2020-09-19 LAB — POCT GLYCOSYLATED HEMOGLOBIN (HGB A1C)
Est. average glucose Bld gHb Est-mCnc: 206
Hemoglobin A1C: 8.8 % — AB (ref 4.0–5.6)

## 2020-09-19 NOTE — Progress Notes (Signed)
I,Jeremy Salas,acting as a scribe for Jeremy Durie, MD.,have documented all relevant documentation on the behalf of Jeremy Durie, MD,as directed by  Jeremy Durie, MD while in the presence of Jeremy Durie, MD.   Established patient visit   Patient: Jeremy Salas   DOB: Jeremy 29, 1992   30 y.o. Male  MRN: [id number] Visit Date: 09/19/2020  Today's healthcare provider: [name], MD   Chief Complaint  Patient presents with   Follow-up   Diabetes   Subjective    HPI  Patient is actually feeling well.  He has no complaints today.  He states his neuropathy is actually better than last visit.  He is having no hypoglycemia.  He states he is taking his medications as prescribed and blood sugars are improving Diabetes Mellitus Type II, Follow-up  Lab Results  Component Value Date   HGBA1C 9.9 (H) 05/15/2020   HGBA1C 9.9 (H) 12/01/2018   Wt Readings from Last 3 Encounters:  09/19/20 299 lb (135.6 kg)  05/15/20 297 lb (134.7 kg)  02/16/20 (!) 305 lb (138.3 kg)   Last seen for diabetes 4 months ago.  Management since then includes no medication changes. Start checking blood sugars at home. Glucometer sent into the pharmacy.  He reports good compliance with treatment. He is not having side effects. none   Home blood sugar records: fasting range: 140-180  Episodes of hypoglycemia? No none   Current insulin regiment:  Most Recent Eye Exam:  Current exercise: yard work Current diet habits: well balanced, no sweet drinks and no fried foods  Pertinent Labs: No results found for: CHOL, HDL, LDLCALC, LDLDIRECT, TRIG, CHOLHDL Lab Results  Component Value Date   NA 140 05/15/2020   K 4.6 05/15/2020   CREATININE 0.73 (L) 05/15/2020   GFRNONAA >60 12/03/2018   GFRAA >60 12/03/2018   GLUCOSE 194 (H) 05/15/2020        Medications: Outpatient Medications Prior to Visit  Medication Sig   blood glucose meter kit and supplies Dispense based on  patient and insurance preference. Use Once daily as directed. (FOR ICD-10 E11.9).   insulin NPH-regular Human (NOVOLIN 70/30 RELION) (70-30) 100 UNIT/ML injection Inject 15 Units into the skin 2 (two) times daily with a meal.   Insulin Syringe-Needle U-100 (RELION INSULIN SYR 0.5CC/30G) 30G X 5/16" 0.5 ML MISC Use with insulin 70/30   metFORMIN (GLUCOPHAGE-XR) 500 MG 24 hr tablet TAKE 1 TABLET BY MOUTH DAILY WITH BREAKFAST   methocarbamol (ROBAXIN) 500 MG tablet TAKE TWO TABLETS BY MOUTH EVERY 8 HOURS AS NEEDED FOR MUSCLE SPASMS   omeprazole (PRILOSEC OTC) 20 MG tablet Take 20 mg by mouth daily before breakfast.   [DISCONTINUED] calcium-vitamin D (OSCAL WITH D) 500-200 MG-UNIT tablet Take 1 tablet by mouth 3 (three) times daily. (Patient not taking: Reported on 02/16/2020)   No facility-administered medications prior to visit.    Review of Systems  Constitutional:  Negative for activity change.  Respiratory:  Negative for cough and shortness of breath.   Cardiovascular:  Negative for chest pain, palpitations and leg swelling.  Endocrine: Negative for cold intolerance, heat intolerance, polydipsia, polyphagia and polyuria.  Musculoskeletal:  Negative for arthralgias and myalgias.  Neurological:  Negative for dizziness, light-headedness and headaches.  Psychiatric/Behavioral:  Negative for agitation, self-injury, sleep disturbance and suicidal ideas. The patient is not nervous/anxious.    Last hemoglobin A1c Lab Results  Component Value Date   HGBA1C 8.8 (A) 09/19/2020  Objective    BP 132/87 (BP Location: Right Arm, Patient Position: Sitting, Cuff Size: Large)   Pulse 86   Temp 97.9 F (36.6 C) (Temporal)   Resp 16   Ht '5\' 10"'  (1.778 m)   Wt 299 lb (135.6 kg)   SpO2 97%   BMI 42.90 kg/m  BP Readings from Last 3 Encounters:  09/19/20 132/87  05/15/20 (!) 134/98  02/16/20 129/70   Wt Readings from Last 3 Encounters:  09/19/20 299 lb (135.6 kg)  05/15/20 297 lb (134.7 kg)   02/16/20 (!) 305 lb (138.3 kg)      Physical Exam Vitals reviewed.  Constitutional:      General: He is not in acute distress.    Appearance: He is well-developed.  HENT:     Head: Normocephalic and atraumatic.     Right Ear: Hearing normal.     Left Ear: Hearing normal.     Nose: Nose normal.  Eyes:     General: Lids are normal. No scleral icterus.       Right eye: No discharge.        Left eye: No discharge.     Conjunctiva/sclera: Conjunctivae normal.  Cardiovascular:     Rate and Rhythm: Normal rate and regular rhythm.     Heart sounds: Normal heart sounds.  Pulmonary:     Effort: Pulmonary effort is normal. No respiratory distress.     Breath sounds: Normal breath sounds.  Musculoskeletal:        General: Normal range of motion.  Skin:    Findings: No lesion or rash.  Neurological:     General: No focal deficit present.     Mental Status: He is alert and oriented to person, place, and time.  Psychiatric:        Speech: Speech normal.        Behavior: Behavior normal.        Thought Content: Thought content normal.        Judgment: Judgment normal.      No results found for any visits on 09/19/20.  Assessment & Plan     1. Diabetes mellitus without complication [facility]) Patient needs physical and needs complete lab work.  He needs a microalbumin of his urine. Consider statin.  A1c is better today at 8.8 which is down from the last 2 times when it was 9.9.  Continue metformin and insulin`70/30 - POCT glycosylated hemoglobin (Hb A1C)  2. Obesity, Class III, BMI 40-49.9 (morbid obesity) (HCC) Diet and exercise and weight loss discussed at length.  3. Marijuana dependence ([place])    No follow-ups on file.      I, Jeremy Durie, MD, have reviewed all documentation for this visit. The documentation on 09/19/20 for the exam, diagnosis, procedures, and orders are all accurate and complete.    Aulden Calise Cranford Mon, MD  [facility] [phone] (phone) [phone] (fax)  [place]

## 2020-10-15 ENCOUNTER — Other Ambulatory Visit: Payer: Self-pay | Admitting: Family Medicine

## 2020-10-15 DIAGNOSIS — E119 Type 2 diabetes mellitus without complications: Secondary | ICD-10-CM

## 2020-10-15 NOTE — Telephone Encounter (Signed)
Requested Prescriptions  Pending Prescriptions Disp Refills  . metFORMIN (GLUCOPHAGE-XR) 500 MG 24 hr tablet [Pharmacy Med Name: METFORMIN HCL XR 500 MG TABLET] 90 tablet 1    Sig: TAKE ONE TABLET BY MOUTH DAILY WITH BREAKFAST     Endocrinology:  Diabetes - Biguanides Failed - 10/15/2020  6:21 AM      Failed - Cr in normal range and within 360 days    Creatinine, Ser  Date Value Ref Range Status  05/15/2020 0.73 (L) 0.76 - 1.27 mg/dL Final         Failed - HBA1C is between 0 and 7.9 and within 180 days    Hemoglobin A1C  Date Value Ref Range Status  09/19/2020 8.8 (A) 4.0 - 5.6 % Final   Hgb A1c MFr Bld  Date Value Ref Range Status  05/15/2020 9.9 (H) 4.8 - 5.6 % Final    Comment:             Prediabetes: 5.7 - 6.4          Diabetes: >6.4          Glycemic control for adults with diabetes: <7.0          Passed - eGFR in normal range and within 360 days    GFR calc Af Amer  Date Value Ref Range Status  12/03/2018 >60 >60 mL/min Final   GFR calc non Af Amer  Date Value Ref Range Status  12/03/2018 >60 >60 mL/min Final   eGFR  Date Value Ref Range Status  05/15/2020 125 >59 mL/min/1.73 Final         Passed - Valid encounter within last 6 months    Recent Outpatient Visits          3 weeks ago Diabetes mellitus without complication [facility])   [facility] Jerrol Banana., MD   5 months ago Diabetes mellitus without complication [facility])   [facility] Jerrol Banana., MD   8 months ago Diabetes mellitus without complication [facility])   [place], Kelby Aline, FNP      Future Appointments            In 3 months Gwyneth Sprout, [place], [facility]

## 2020-10-23 ENCOUNTER — Ambulatory Visit: Payer: Medicaid Other | Admitting: Family Medicine

## 2021-01-23 ENCOUNTER — Ambulatory Visit (INDEPENDENT_AMBULATORY_CARE_PROVIDER_SITE_OTHER): Payer: Self-pay | Admitting: Family Medicine

## 2021-01-23 ENCOUNTER — Encounter: Payer: Self-pay | Admitting: Family Medicine

## 2021-01-23 ENCOUNTER — Other Ambulatory Visit: Payer: Self-pay

## 2021-01-23 VITALS — BP 136/84 | HR 103 | Resp 16 | Wt 303.2 lb

## 2021-01-23 DIAGNOSIS — F1010 Alcohol abuse, uncomplicated: Secondary | ICD-10-CM

## 2021-01-23 DIAGNOSIS — F1220 Cannabis dependence, uncomplicated: Secondary | ICD-10-CM

## 2021-01-23 DIAGNOSIS — Z794 Long term (current) use of insulin: Secondary | ICD-10-CM

## 2021-01-23 DIAGNOSIS — E1140 Type 2 diabetes mellitus with diabetic neuropathy, unspecified: Secondary | ICD-10-CM

## 2021-01-23 DIAGNOSIS — Z6841 Body Mass Index (BMI) 40.0 and over, adult: Secondary | ICD-10-CM | POA: Insufficient documentation

## 2021-01-23 LAB — POCT GLYCOSYLATED HEMOGLOBIN (HGB A1C): Hemoglobin A1C: 9 % — AB (ref 4.0–5.6)

## 2021-01-23 MED ORDER — NOVOLIN 70/30 RELION (70-30) 100 UNIT/ML ~~LOC~~ SUSP: 20.0000 [IU] | Freq: Two times a day (BID) | SUBCUTANEOUS | 3 refills | Status: DC

## 2021-01-23 NOTE — Assessment & Plan Note (Signed)
BMI 43.5 Discussed importance of healthy weight management Discussed diet and exercise

## 2021-01-23 NOTE — Assessment & Plan Note (Signed)
Odor present during exam Pt reported no use this morning Encouraged to cut back on use to prevent damage to vessels d/t inflammation

## 2021-01-23 NOTE — Progress Notes (Signed)
Established patient visit   Patient: Jeremy Salas   DOB: 1990/07/04   31 y.o. Male  MRN: [id number] Visit Date: 01/23/2021  Today's healthcare provider: Gwyneth Sprout, FNP   Chief Complaint  Patient presents with   Diabetes   Subjective    HPI  Diabetes Mellitus Type II, Follow-up  Lab Results  Component Value Date   HGBA1C 9.0 (A) 01/23/2021   HGBA1C 8.8 (A) 09/19/2020   HGBA1C 9.9 (H) 05/15/2020   Wt Readings from Last 3 Encounters:  01/23/21 (!) 303 lb 3.2 oz (137.5 kg)  09/19/20 299 lb (135.6 kg)  05/15/20 297 lb (134.7 kg)   Last seen for diabetes 4 months ago.  Management since then includes none. He reports excellent compliance with treatment. He is not having side effects.  Symptoms: No fatigue No foot ulcerations  No appetite changes No nausea  No paresthesia of the feet  No polydipsia  No polyuria No visual disturbances   No vomiting     Home blood sugar records:  180-200  Episodes of hypoglycemia? no   Current insulin regiment: Novolin 70/30. 15 Units BID with meals Most Recent Eye Exam: >1 year Current exercise: not asked Current diet habits: in general, an "unhealthy" diet  Pertinent Labs: No results found for: CHOL, HDL, LDLCALC, LDLDIRECT, TRIG, CHOLHDL Lab Results  Component Value Date   NA 140 05/15/2020   K 4.6 05/15/2020   CREATININE 0.73 (L) 05/15/2020   EGFR 125 05/15/2020     ---------------------------------------------------------------------------------------------------   Medications: Outpatient Medications Prior to Visit  Medication Sig   blood glucose meter kit and supplies Dispense based on patient and insurance preference. Use Once daily as directed. (FOR ICD-10 E11.9).   Insulin Syringe-Needle U-100 (RELION INSULIN SYR 0.5CC/30G) 30G X 5/16" 0.5 ML MISC Use with insulin 70/30   metFORMIN (GLUCOPHAGE-XR) 500 MG 24 hr tablet TAKE ONE TABLET BY MOUTH DAILY WITH BREAKFAST   methocarbamol (ROBAXIN) 500 MG tablet  TAKE TWO TABLETS BY MOUTH EVERY 8 HOURS AS NEEDED FOR MUSCLE SPASMS   omeprazole (PRILOSEC OTC) 20 MG tablet Take 20 mg by mouth daily before breakfast.   [DISCONTINUED] insulin NPH-regular Human (NOVOLIN 70/30 RELION) (70-30) 100 UNIT/ML injection Inject 15 Units into the skin 2 (two) times daily with a meal.   No facility-administered medications prior to visit.    Review of Systems     Objective    BP 136/84    Pulse (!) 103    Resp 16    Wt (!) 303 lb 3.2 oz (137.5 kg)    SpO2 93%    BMI 43.50 kg/m    Physical Exam Vitals and nursing note reviewed.  Constitutional:      Appearance: Normal appearance. He is morbidly obese.  HENT:     Head: Normocephalic and atraumatic.  Eyes:     Pupils: Pupils are equal, round, and reactive to light.  Cardiovascular:     Rate and Rhythm: Regular rhythm. Tachycardia present.     Pulses: Normal pulses.     Heart sounds: Normal heart sounds.  Pulmonary:     Effort: Pulmonary effort is normal.     Breath sounds: Normal breath sounds.  Abdominal:     General: Bowel sounds are normal.     Palpations: Abdomen is soft.  Musculoskeletal:        General: Normal range of motion.     Cervical back: Normal range of motion.  Skin:    General:  Skin is warm and dry.     Capillary Refill: Capillary refill takes less than 2 seconds.  Neurological:     General: No focal deficit present.     Mental Status: He is alert and oriented to person, place, and time. Mental status is at baseline.  Psychiatric:        Mood and Affect: Mood normal.        Behavior: Behavior normal.        Thought Content: Thought content normal.     Results for orders placed or performed in visit on 01/23/21  POCT glycosylated hemoglobin (Hb A1C)  Result Value Ref Range   Hemoglobin A1C 9.0 (A) 4.0 - 5.6 %   HbA1c POC (<> result, manual entry)     HbA1c, POC (prediabetic range)     HbA1c, POC (controlled diabetic range)      Assessment & Plan     Problem List Items  Addressed This Visit       Endocrine   Type 2 diabetes mellitus with diabetic neuropathy, with long-term current use of insulin (HCC) - Primary    A1c goal of <7% Increase of insulin today Reinforce diet Pt report diet includes limited fried foods, increase in vegetables, no sodas FBG reflects A1c increase Encouraged exercise as tolerated Continue to recommend balanced, lower carb meals. Smaller meal size, adding snacks. Choosing water as drink of choice and increasing purposeful exercise.       Relevant Medications   insulin NPH-regular Human (NOVOLIN 70/30 RELION) (70-30) 100 UNIT/ML injection   Other Relevant Orders   POCT glycosylated hemoglobin (Hb A1C) (Completed)   Urine Microalbumin w/creat. ratio   AMB Referral to Community Care Coordinaton     Other   Marijuana dependence (Jeremy Salas) (Chronic)    Odor present during exam Pt reported no use this morning Encouraged to cut back on use to prevent damage to vessels d/t inflammation      BMI 40.0-44.9, adult (HCC)    BMI 43.5 Discussed importance of healthy weight management Discussed diet and exercise       Relevant Medications   insulin NPH-regular Human (NOVOLIN 70/30 RELION) (70-30) 100 UNIT/ML injection   RESOLVED: Alcohol abuse     Return in about 3 months (around 04/23/2021) for T2DM management.      Jeremy Kotyk, FNP, have reviewed all documentation for this visit. The documentation on 01/23/21 for the exam, diagnosis, procedures, and orders are all accurate and complete.  Patient seen and examined by Jeremy Joe,  FNP note scribed by Jeremy Salas, Jeremy Salas, [place] [phone] (phone) [phone] (fax)  Jeremy Salas

## 2021-01-23 NOTE — Assessment & Plan Note (Signed)
A1c goal of <7% Increase of insulin today Reinforce diet Pt report diet includes limited fried foods, increase in vegetables, no sodas FBG reflects A1c increase Encouraged exercise as tolerated Continue to recommend balanced, lower carb meals. Smaller meal size, adding snacks. Choosing water as drink of choice and increasing purposeful exercise.

## 2021-01-25 LAB — MICROALBUMIN / CREATININE URINE RATIO
Creatinine, Urine: 161.5 mg/dL
Microalb/Creat Ratio: 14 mg/g creat (ref 0–29)
Microalbumin, Urine: 22.6 ug/mL

## 2021-02-05 ENCOUNTER — Telehealth: Payer: Self-pay | Admitting: *Deleted

## 2021-02-05 NOTE — Telephone Encounter (Signed)
° °  Telephone encounter was:  Unsuccessful.  02/05/2021 Name: Jeremy Salas MRN: [id number] DOB: 11-19-1990  Unsuccessful outbound call made today to assist with:   diabetic eye exam  Outreach Attempt:  1st Attempt No voicemail setup  Alois Cliche -[facility] Guide , Embedded Care Coordination [facility], Care Management  [phone] [address] [place] , [place] [address] Email : [email or web address]

## 2021-02-11 ENCOUNTER — Telehealth: Payer: Self-pay | Admitting: *Deleted

## 2021-02-11 NOTE — Telephone Encounter (Signed)
° °  Telephone encounter was:  Unsuccessful.  02/11/2021 Name: Jeremy Salas MRN: [id number] DOB: [birth year]  Unsuccessful outbound call made today to assist with:  eye dr  Nicholes Stairs Attempt:  2nd Attempt  No voicemail set up  Alois Cliche -[facility] Guide , Embedded Care Coordination [facility], Care Management  [phone] [address] [place] , [place] [address] Email : [email or web address]

## 2021-02-12 ENCOUNTER — Telehealth: Payer: Self-pay | Admitting: *Deleted

## 2021-02-12 NOTE — Telephone Encounter (Signed)
° °  Telephone encounter was:  Unsuccessful.  02/12/2021 Name: Jeremy Salas MRN: [id number] DOB: 01/07/1990  Unsuccessful outbound call made today to assist with:   eye exam  Outreach Attempt:  3rd Attempt.  Referral closed unable to contact patient.  A HIPAA compliant voice message was left requesting a return call.  Instructed patient to call back at   Instructed patient to call back at [phone]  at their earliest convenience. .Called Patient on listed numbers and left messages if able no response from patient Alois Cliche -[facility] Guide , Embedded Care Coordination [facility], Care Management  [phone] [address] [place] , [place] [address] Email : [email or web address]

## 2021-04-21 ENCOUNTER — Telehealth: Payer: Self-pay | Admitting: Family Medicine

## 2021-04-21 DIAGNOSIS — E119 Type 2 diabetes mellitus without complications: Secondary | ICD-10-CM

## 2021-04-21 MED ORDER — METFORMIN HCL ER 500 MG PO TB24: 500.0000 mg | ORAL_TABLET | Freq: Every day | ORAL | 1 refills | Status: DC

## 2021-04-21 NOTE — Telephone Encounter (Signed)
[facility] faxed refill request for the following medications: ° °metFORMIN (GLUCOPHAGE-XR) 500 MG 24 hr tablet  ° °Please advise. ° °

## 2021-05-07 ENCOUNTER — Encounter: Payer: Self-pay | Admitting: Family Medicine

## 2021-05-07 ENCOUNTER — Ambulatory Visit (INDEPENDENT_AMBULATORY_CARE_PROVIDER_SITE_OTHER): Payer: Self-pay | Admitting: Family Medicine

## 2021-05-07 VITALS — BP 147/91 | HR 82 | Temp 98.4°F | Resp 16 | Wt 308.4 lb

## 2021-05-07 DIAGNOSIS — E1165 Type 2 diabetes mellitus with hyperglycemia: Secondary | ICD-10-CM

## 2021-05-07 DIAGNOSIS — R030 Elevated blood-pressure reading, without diagnosis of hypertension: Secondary | ICD-10-CM

## 2021-05-07 NOTE — Progress Notes (Signed)
?  ?I,Tiffany J Bragg,acting as a scribe for Gwyneth Sprout, FNP.,have documented all relevant documentation on the behalf of Gwyneth Sprout, FNP,as directed by  Gwyneth Sprout, FNP while in the presence of Gwyneth Sprout, FNP.  ? ?Established patient visit ? ? ?Patient: Jeremy Salas   DOB: 12-Jan-1990   31 y.o. Male  MRN: [id number] ?Visit Date: 05/07/2021 ? ?Today's healthcare provider: Gwyneth Sprout, FNP  ?Re Introduced to nurse practitioner role and practice setting.  All questions answered.  Discussed provider/patient relationship and expectations. ? ? ?Chief Complaint  ?Patient presents with  ? Diabetes  ? ?Subjective  ?  ?HPI  ?Diabetes Mellitus Type II, Follow-up ? ?Lab Results  ?Component Value Date  ? HGBA1C 9.0 (A) 01/23/2021  ? HGBA1C 8.8 (A) 09/19/2020  ? HGBA1C 9.9 (H) 05/15/2020  ? ?Wt Readings from Last 3 Encounters:  ?05/07/21 (!) 308 lb 6.4 oz (139.9 kg)  ?01/23/21 (!) 303 lb 3.2 oz (137.5 kg)  ?09/19/20 299 lb (135.6 kg)  ? ?Last seen for diabetes 3 months ago.  ?Management since then includes Increase of insulin today ?Reinforce diet.Encouraged exercise as tolerated ?Continue to recommend balanced, lower carb meals. Smaller meal size, adding snacks. Choosing water as drink of choice and increasing purposeful exercise. ?He reports excellent compliance with treatment. ?He is not having side effects.  ?Symptoms: ?No fatigue No foot ulcerations  ?No appetite changes No nausea  ?No paresthesia of the feet  No polydipsia  ?No polyuria No visual disturbances   ?No vomiting   ? ? ?Home blood sugar records: fasting range: 125-140 ? ?Episodes of hypoglycemia? No  ?  ?Current insulin regiment: Novolin 70/30 20units BID ?Most Recent Eye Exam: >1 year ?Current exercise: walking ?Current diet habits: well balanced ? ?Pertinent Labs: ?No results found for: CHOL, HDL, LDLCALC, LDLDIRECT, TRIG, CHOLHDL Lab Results  ?Component Value Date  ? NA 140 05/15/2020  ? K 4.6 05/15/2020  ? CREATININE 0.73 (L) 05/15/2020  ?  EGFR 125 05/15/2020  ? LABMICR 22.6 01/24/2021  ?  ? ?---------------------------------------------------------------------------------------------------  ? ?Medications: ?Outpatient Medications Prior to Visit  ?Medication Sig  ? blood glucose meter kit and supplies Dispense based on patient and insurance preference. Use Once daily as directed. (FOR ICD-10 E11.9).  ? insulin NPH-regular Human (NOVOLIN 70/30 RELION) (70-30) 100 UNIT/ML injection Inject 20 Units into the skin 2 (two) times daily with a meal. Titrate to Fasting Blood Sugar to range of 85-120.  ? Insulin Syringe-Needle U-100 (RELION INSULIN SYR 0.5CC/30G) 30G X 5/16" 0.5 ML MISC Use with insulin 70/30  ? metFORMIN (GLUCOPHAGE-XR) 500 MG 24 hr tablet Take 1 tablet (500 mg total) by mouth daily with breakfast.  ? omeprazole (PRILOSEC OTC) 20 MG tablet Take 20 mg by mouth daily before breakfast.  ? [DISCONTINUED] methocarbamol (ROBAXIN) 500 MG tablet TAKE TWO TABLETS BY MOUTH EVERY 8 HOURS AS NEEDED FOR MUSCLE SPASMS  ? ?No facility-administered medications prior to visit.  ? ? ?Review of Systems ? ? ?  Objective  ?  ?BP (!) 147/91   Pulse 82   Temp 98.4 ?F (36.9 ?C) (Oral)   Resp 16   Wt (!) 308 lb 6.4 oz (139.9 kg)   SpO2 96%   BMI 44.25 kg/m?  ? ? ?Physical Exam ?Vitals and nursing note reviewed.  ?Constitutional:   ?   Appearance: Normal appearance. He is obese.  ?HENT:  ?   Head: Normocephalic and atraumatic.  ?Eyes:  ?   Pupils:  Pupils are equal, round, and reactive to light.  ?Cardiovascular:  ?   Rate and Rhythm: Normal rate and regular rhythm.  ?   Pulses: Normal pulses.  ?   Heart sounds: Normal heart sounds.  ?Pulmonary:  ?   Effort: Pulmonary effort is normal.  ?   Breath sounds: Normal breath sounds.  ?Musculoskeletal:     ?   General: Normal range of motion.  ?   Cervical back: Normal range of motion.  ?Skin: ?   General: Skin is warm and dry.  ?   Capillary Refill: Capillary refill takes less than 2 seconds.  ?Neurological:  ?    General: No focal deficit present.  ?   Mental Status: He is alert and oriented to person, place, and time. Mental status is at baseline.  ?Psychiatric:     ?   Mood and Affect: Mood normal.     ?   Behavior: Behavior normal.     ?   Thought Content: Thought content normal.     ?   Judgment: Judgment normal.  ?  ? ?No results found for any visits on 05/07/21. ? Assessment & Plan  ?  ? ?Problem List Items Addressed This Visit   ? ?  ? Endocrine  ? Uncontrolled type 2 diabetes mellitus with hyperglycemia (HCC) - Primary  ?  Chronic, unstable previously ?Has been using insulin since hospitalization ?Currently doing 20 U BID of 70/30; also have started 526m XR Metformin QD ?Checking BG 1-2 times/week; 120-140s. ?Continue to recommend balanced, lower carb meals. Smaller meal size, adding snacks. Choosing water as drink of choice and increasing purposeful exercise. ? ? ?  ?  ? Relevant Orders  ? Hemoglobin A1c  ?  ? Other  ? Elevated blood pressure reading without diagnosis of hypertension  ?  Acute, elevated ?Recommend BP goal <130/<80 with DM ?Not currently on medication ?Continue to recommend low salt diet, and exercise ?Will recheck at CPE ? ?  ?  ? ? ? ?Return in about 3 months (around 08/07/2021) for annual examination.  ?   ? ?I, EGwyneth Sprout FNP, have reviewed all documentation for this visit. The documentation on 05/07/21 for the exam, diagnosis, procedures, and orders are all accurate and complete. ? ? ? ?EGwyneth Sprout FNP  ?[place]?[phone](phone) ?[phone](fax) ? ?[facility] ?

## 2021-05-07 NOTE — Assessment & Plan Note (Signed)
Acute, elevated ?Recommend BP goal <130/<80 with DM ?Not currently on medication ?Continue to recommend low salt diet, and exercise ?Will recheck at CPE ?

## 2021-05-07 NOTE — Assessment & Plan Note (Addendum)
Chronic, unstable previously ?Has been using insulin since hospitalization ?Currently doing 20 U BID of 70/30; also have started 500mg  XR Metformin QD ?Checking BG 1-2 times/week; 120-140s. ?Continue to recommend balanced, lower carb meals. Smaller meal size, adding snacks. Choosing water as drink of choice and increasing purposeful exercise. ? ?

## 2021-05-08 ENCOUNTER — Other Ambulatory Visit: Payer: Self-pay | Admitting: Family Medicine

## 2021-05-08 DIAGNOSIS — E1165 Type 2 diabetes mellitus with hyperglycemia: Secondary | ICD-10-CM

## 2021-05-08 DIAGNOSIS — E119 Type 2 diabetes mellitus without complications: Secondary | ICD-10-CM

## 2021-05-08 LAB — HEMOGLOBIN A1C
Est. average glucose Bld gHb Est-mCnc: 249 mg/dL
Hgb A1c MFr Bld: 10.3 % — ABNORMAL HIGH (ref 4.8–5.6)

## 2021-05-08 MED ORDER — METFORMIN HCL ER 500 MG PO TB24: 1000.0000 mg | ORAL_TABLET | Freq: Two times a day (BID) | ORAL | 1 refills | Status: DC

## 2021-05-16 ENCOUNTER — Ambulatory Visit (INDEPENDENT_AMBULATORY_CARE_PROVIDER_SITE_OTHER): Payer: Self-pay | Admitting: Physician Assistant

## 2021-05-16 ENCOUNTER — Encounter: Payer: Self-pay | Admitting: Physician Assistant

## 2021-05-16 VITALS — BP 147/103 | HR 77 | Temp 97.9°F | Ht 70.0 in | Wt 303.5 lb

## 2021-05-16 DIAGNOSIS — B9689 Other specified bacterial agents as the cause of diseases classified elsewhere: Secondary | ICD-10-CM

## 2021-05-16 DIAGNOSIS — H109 Unspecified conjunctivitis: Secondary | ICD-10-CM

## 2021-05-16 MED ORDER — ERYTHROMYCIN 5 MG/GM OP OINT
1.0000 | TOPICAL_OINTMENT | Freq: Four times a day (QID) | OPHTHALMIC | 0 refills | Status: AC
Start: 2021-05-16 — End: 2021-05-23

## 2021-05-16 NOTE — Patient Instructions (Addendum)
Based on your symptoms I believe that you have bacterial conjunctivitis  ?I have sent in a script for Erythromycin ophthalmic ointment - please apply a 1/2 inch strip of the ointment to your right eye every 6 hours for 7 days  ? ?You can use sterile eye flushes and lubricating eye drops (Blink or rephresh) to assist with eye irritation and further resolution ?You can also use a warm compress over the eye to assist with swelling and matting - especially in the morning  ?Outside of applying the medicine or eye washes do not touch your eyes or rub them. This can lead to further infection or worsening symptoms ? ?If you have used makeup or mascara on that eye I recommend discarding it as this can cause recurrent infection. Thoroughly wash any makeup brushes and avoid using makeup while recovering from the infection.  ? ?If you notice the following please return to the office: lack of improvement, eyelid swelling, increased eye irritation ?If you notice the following please go to the ED: eye pressure causing displacement of the eye, vision changes, increased eye pain or foreign body sensation, fever  ?

## 2021-05-16 NOTE — Progress Notes (Signed)
? ? ? ?     Established Patient Office Visit ? ?Name: Jeremy Salas   MRN: [id number]    DOB: 1990/07/25   Date:05/16/2021 ? ?Today's Provider: Talitha Givens, MHS, PA-C ?Introduced myself to the patient as a Journalist, newspaper and provided education on APPs in clinical practice.  ? ? ?I,Tiffany J Bragg,acting as a Education administrator for [employer], PA-C.,have documented all relevant documentation on the behalf of Erin E Mecum, PA-C,as directed by  [employer], PA-C while in the presence of Erin E Mecum, PA-C.  ? ?      ?Subjective ? ?Chief Complaint ? ?Chief Complaint  ?Patient presents with  ? Eye Problem  ? ? ?HPI ? ?States he thinks he has "pink eye" ?Denies having pink eye in the past  ?States it started on Monday and he has tried "pink eye relief eye drops" along with regular eye drops  ?He has tried warm and cold compresses and warm lemon compress over the eyes ?States both eyes are affected ?Reports he though it was allergies at first but it is not improving ?He states he is not a contact lens user   ? ? ? ?Patient Active Problem List  ? Diagnosis Date Noted  ? Uncontrolled type 2 diabetes mellitus with hyperglycemia ([place]) 05/07/2021  ? Elevated blood pressure reading without diagnosis of hypertension 05/07/2021  ? Type 2 diabetes mellitus with diabetic neuropathy, with long-term current use of insulin ([place]) 01/23/2021  ? ? ?Past Surgical History:  ?Procedure Laterality Date  ? FEMUR IM NAIL Right 11/30/2018  ? Procedure: INTRAMEDULLARY (IM) RETROGRADE FEMORAL NAILING;  Surgeon: Leandrew Koyanagi, MD;  Location: [place];  Service: Orthopedics;  Laterality: Right;  ? FRACTURE SURGERY    ? TONSILLECTOMY    ? ? ?No family history on file. ? ?Social History  ? ?Tobacco Use  ? Smoking status: Former  ?  Types: Cigarettes  ? Smokeless tobacco: Never  ?Substance Use Topics  ? Alcohol use: Yes  ?  Comment: occasional  ? ? ? ?Current Outpatient Medications:  ?  blood glucose meter kit and supplies, Dispense based on patient and insurance preference.  Use Once daily as directed. (FOR ICD-10 E11.9)., Disp: 1 each, Rfl: 0 ?  insulin NPH-regular Human (NOVOLIN 70/30 RELION) (70-30) 100 UNIT/ML injection, Inject 20 Units into the skin 2 (two) times daily with a meal. Titrate to Fasting Blood Sugar to range of 85-120., Disp: 40 mL, Rfl: 3 ?  Insulin Syringe-Needle U-100 (RELION INSULIN SYR 0.5CC/30G) 30G X 5/16" 0.5 ML MISC, Use with insulin 70/30, Disp: 100 each, Rfl: 1 ?  metFORMIN (GLUCOPHAGE-XR) 500 MG 24 hr tablet, Take 2 tablets (1,000 mg total) by mouth 2 (two) times daily after a meal., Disp: 360 tablet, Rfl: 1 ?  omeprazole (PRILOSEC OTC) 20 MG tablet, Take 20 mg by mouth daily before breakfast., Disp: , Rfl:  ? ?No Known Allergies ? ? ? ? ?Review of Systems  ?Constitutional:  Negative for fever.  ?HENT:  Negative for congestion, sinus pain and sore throat.   ?Eyes:  Positive for photophobia, pain and discharge. Negative for blurred vision and double vision.  ?Respiratory:  Negative for cough and shortness of breath.   ?Neurological:  Negative for dizziness and headaches.  ? ? ? ?Objective ? ?Vitals:  ? 05/16/21 1136  ?BP: (!) 147/103  ?Pulse: 77  ?Temp: 97.9 ?F (36.6 ?C)  ?TempSrc: Oral  ?SpO2: 98%  ?Weight: (!) 303 lb 8 oz (137.7 kg)  ?  Height: 5' 10" (1.778 m)  ? ? ?Body mass index is 43.55 kg/m?. ? ?Physical Exam ?Vitals reviewed.  ?Constitutional:   ?   Appearance: Normal appearance. He is obese.  ?HENT:  ?   Head: Normocephalic and atraumatic.  ?Eyes:  ?   General:     ?   Right eye: No foreign body or hordeolum.     ?   Left eye: Discharge present.No hordeolum.  ?   Extraocular Movements:  ?   Right eye: Normal extraocular motion.  ?   Left eye: Normal extraocular motion.  ?   Conjunctiva/sclera:  ?   Right eye: Right conjunctiva is injected.  ?   Left eye: Left conjunctiva is injected. Exudate present.  ?   Pupils: Pupils are equal, round, and reactive to light.  ?   Comments: Mild erythema of eyelids   ?Neurological:  ?   Mental Status: He is alert.   ? ? ? ?Recent Results (from the past 2160 hour(s))  ?Hemoglobin A1c     Status: Abnormal  ? Collection Time: 05/07/21  3:13 PM  ?Result Value Ref Range  ? Hgb A1c MFr Bld 10.3 (H) 4.8 - 5.6 %  ?  Comment:          Prediabetes: 5.7 - 6.4 ?         Diabetes: >6.4 ?         Glycemic control for adults with diabetes: <7.0 ?  ? Est. average glucose Bld gHb Est-mCnc 249 mg/dL  ? ? ? ?PHQ2/9: ? ?  05/07/2021  ?  2:42 PM 02/16/2020  ?  1:29 PM  ?Depression screen PHQ 2/9  ?Decreased Interest 0 0  ?Down, Depressed, Hopeless 0 0  ?PHQ - 2 Score 0 0  ?Altered sleeping 0 0  ?Tired, decreased energy 0 0  ?Change in appetite 0 0  ?Feeling bad or failure about yourself  0 0  ?Trouble concentrating 0 0  ?Moving slowly or fidgety/restless 0 0  ?Suicidal thoughts 0 0  ?PHQ-9 Score 0 0  ?Difficult doing work/chores Not difficult at all Not difficult at all  ?  ? ? ?Fall Risk: ? ?  02/16/2020  ?  1:28 PM  ?Fall Risk   ?Falls in the past year? 0  ?Number falls in past yr: 0  ?Injury with Fall? 0  ? ? ? ? ?Functional Status Survey: ?  ? ? ? ?Assessment & Plan ? ?Problem List Items Addressed This Visit   ?None ?Visit Diagnoses   ? ? Bacterial conjunctivitis of both eyes    -  Primary ?Acute, new problem ?States this began Monday and has not resolved with at home measures ?Will send in  erythromycin ophthalmic ointment 0.5% to be placed in both eyes QID for 7 days ?Reviewed further comfort measures to include warm compresses and eye flushes as needed ?Discussed return and ED precautions ?Follow up as needed for persistent or progressing symptoms   ? Relevant Medications  ? erythromycin ophthalmic ointment  ? ?  ? ? ? ?No follow-ups on file. ? ? ?I, Kellen Hover E Glee Lashomb, PA-C, have reviewed all documentation for this visit. The documentation on 05/16/21 for the exam, diagnosis, procedures, and orders are all accurate and complete. ? ? ?Valena Ivanov, MHS, PA-C ?[facility] ?[facility]  ? ? ?

## 2021-07-24 IMAGING — RF DG C-ARM 1-60 MIN
1 series · 6 of 6 positions shown · non-contrast
Comparison: Same-day x-rays

CLINICAL DATA: Femoral fracture ORIF

EXAM:
RIGHT FEMUR 2 VIEWS; DG C-ARM 1-60 MIN

[Series 1: run · 6 of 6 slices shown]
[im 1/6]
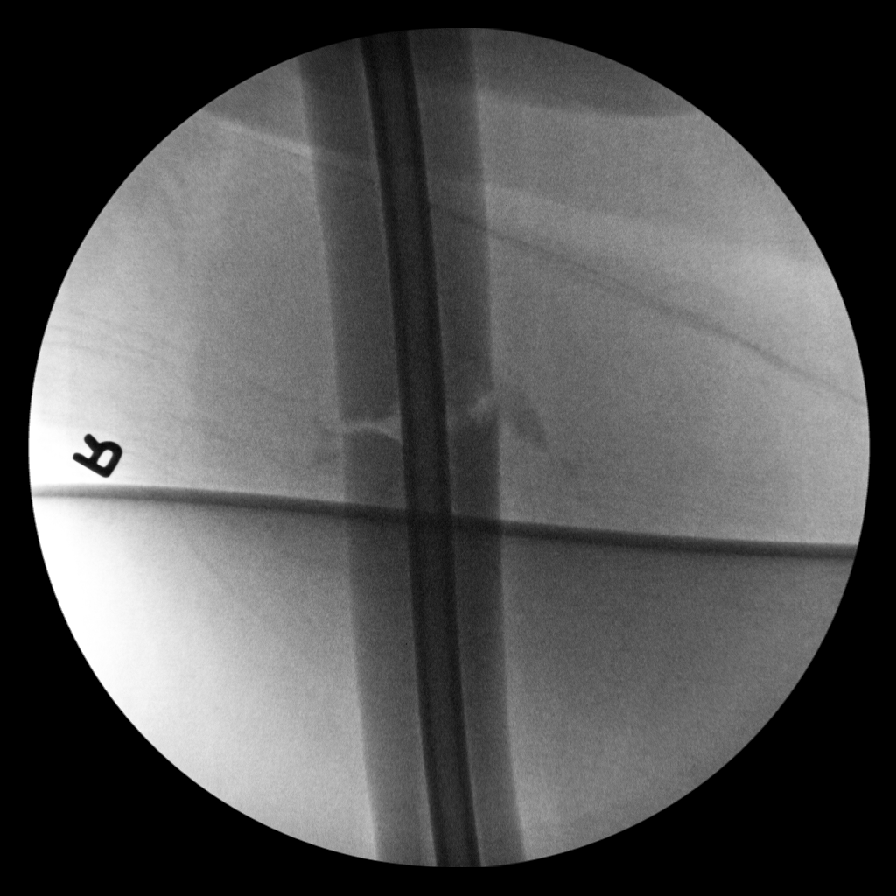
[im 2/6]
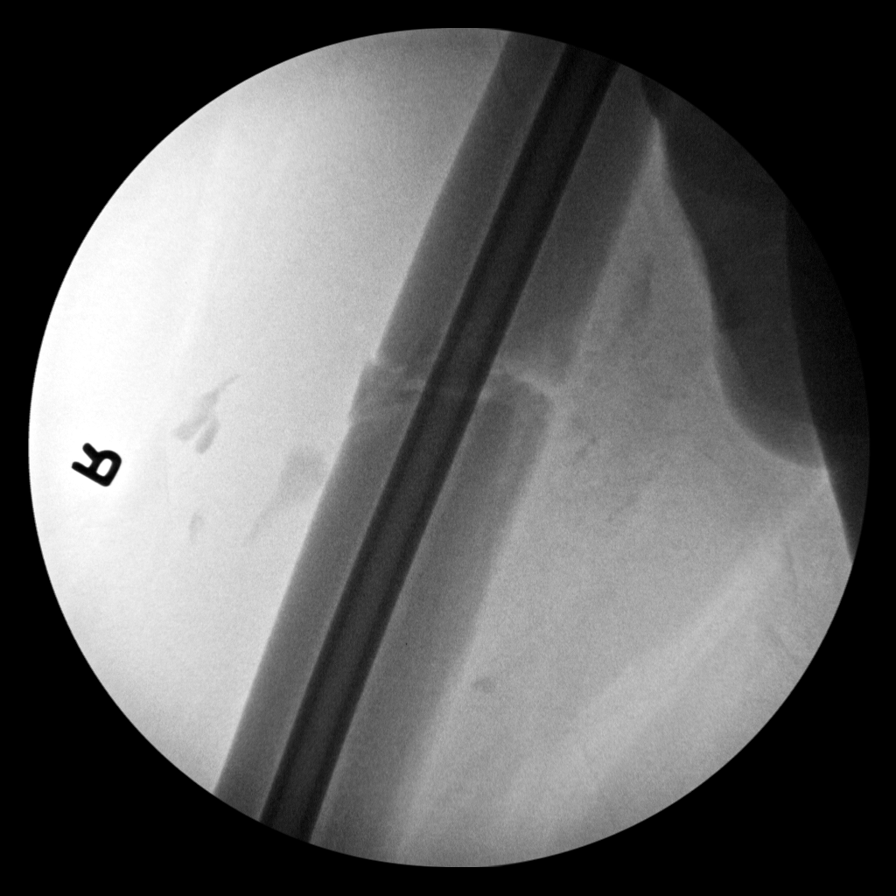
[im 3/6]
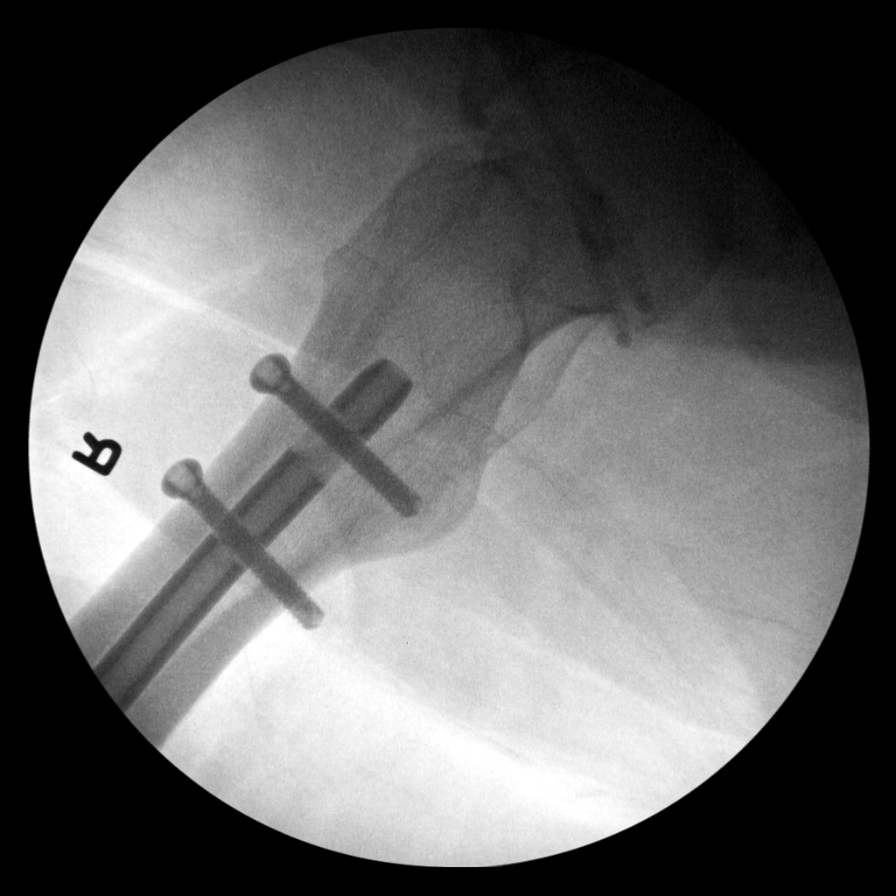
[im 4/6]
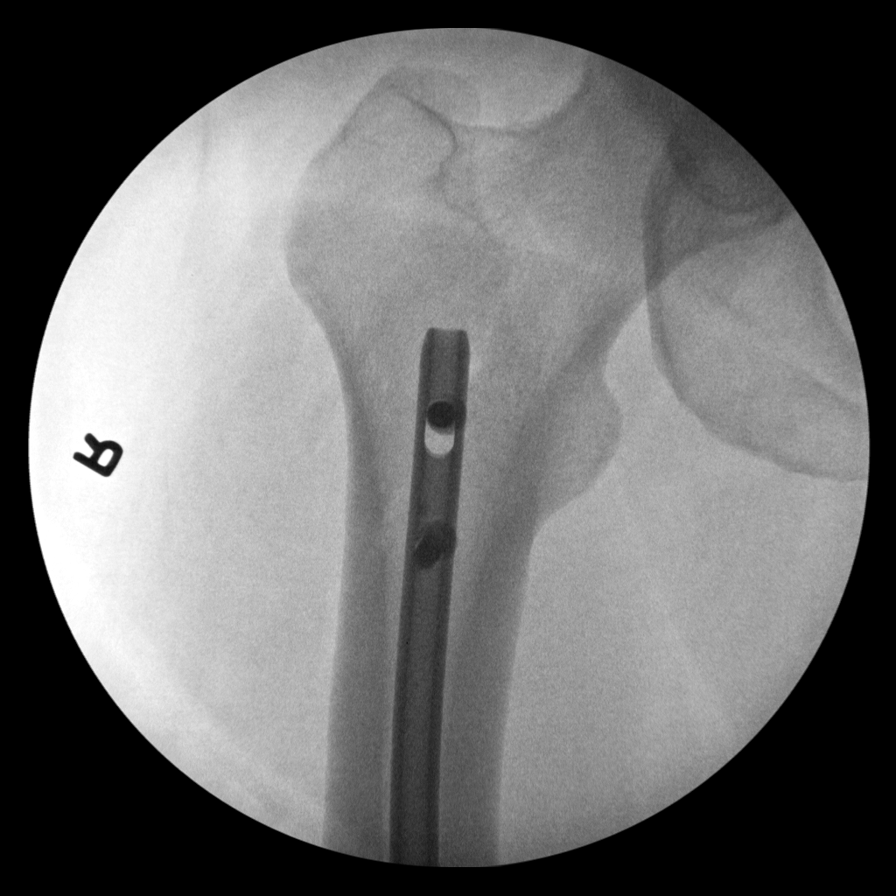
[im 5/6]
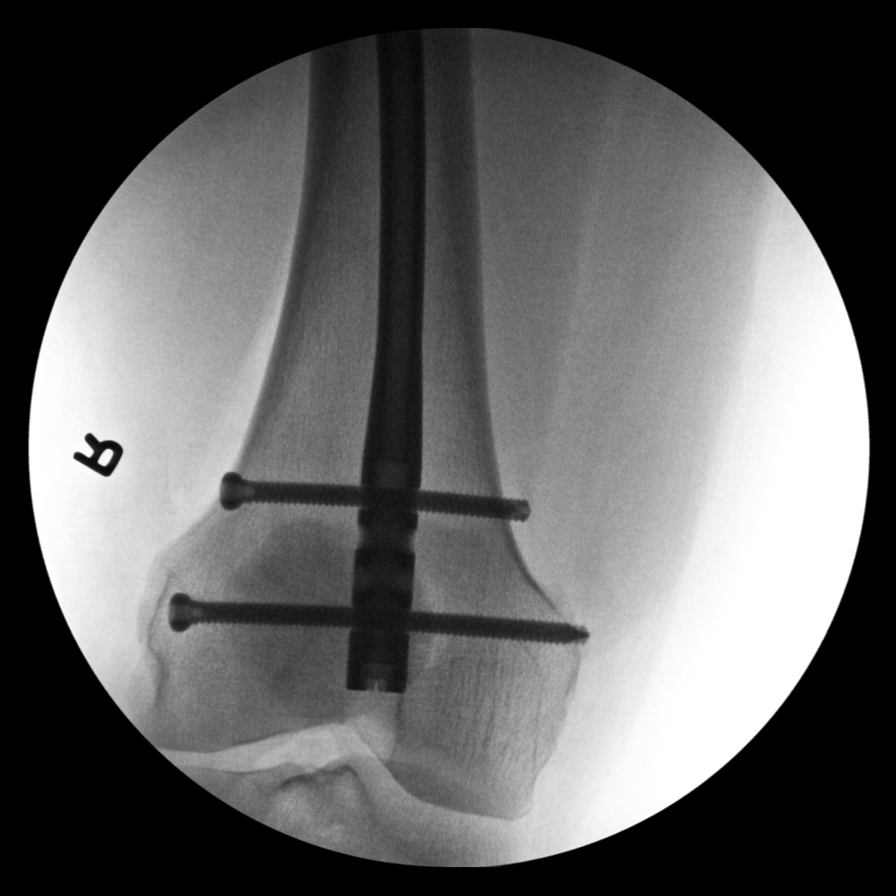
[im 6/6]
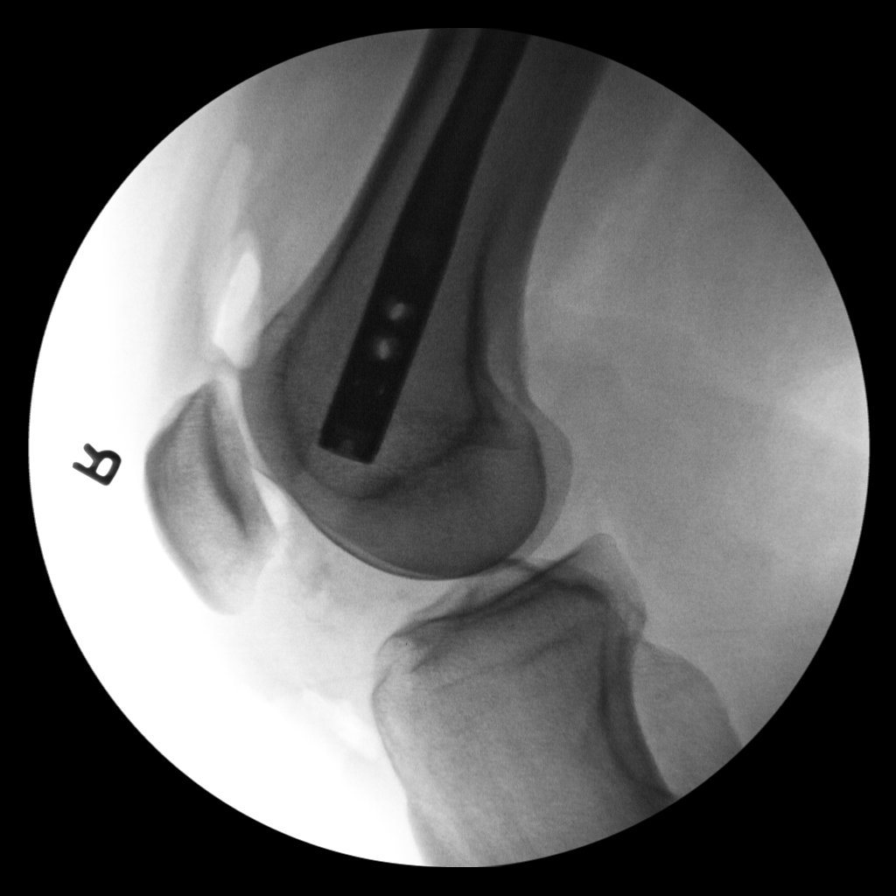

[6 of 6 positions shown; findings below may reference images not displayed]

FINDINGS: 6 C-arm fluoroscopic images were obtained intraoperatively and
submitted for post operative interpretation. Retrograde IM nail with
proximal and distal interlocking screws fixate mid right femoral
diaphyseal fracture. Fracture alignment is improved, now near
anatomic. Expected postoperative changes within the soft tissues at
the level of the knee. 2 minutes and 44 seconds of fluoroscopy time
was utilized. Please see the performing provider's procedural report
for further detail.
IMPRESSION: As above.
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
## 2021-08-13 ENCOUNTER — Encounter: Payer: Medicaid Other | Admitting: Family Medicine
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
## 2021-10-01 ENCOUNTER — Encounter: Payer: Self-pay | Admitting: Family Medicine

## 2021-10-01 ENCOUNTER — Ambulatory Visit (INDEPENDENT_AMBULATORY_CARE_PROVIDER_SITE_OTHER): Payer: Medicaid Other | Admitting: Family Medicine

## 2021-10-01 VITALS — BP 150/100 | HR 87 | Resp 17 | Ht 70.0 in | Wt 309.0 lb

## 2021-10-01 DIAGNOSIS — E1159 Type 2 diabetes mellitus with other circulatory complications: Secondary | ICD-10-CM

## 2021-10-01 DIAGNOSIS — I152 Hypertension secondary to endocrine disorders: Secondary | ICD-10-CM | POA: Diagnosis not present

## 2021-10-01 DIAGNOSIS — Z1159 Encounter for screening for other viral diseases: Secondary | ICD-10-CM | POA: Insufficient documentation

## 2021-10-01 DIAGNOSIS — Z0000 Encounter for general adult medical examination without abnormal findings: Secondary | ICD-10-CM | POA: Diagnosis not present

## 2021-10-01 DIAGNOSIS — E1165 Type 2 diabetes mellitus with hyperglycemia: Secondary | ICD-10-CM | POA: Diagnosis not present

## 2021-10-01 MED ORDER — LOSARTAN POTASSIUM-HCTZ 100-25 MG PO TABS: 1.0000 | ORAL_TABLET | Freq: Every day | ORAL | 0 refills | Status: DC

## 2021-10-01 NOTE — Assessment & Plan Note (Signed)
New diagnosis; recommend starting Hyzaar 100-25 Will schedule a follow up appt Reports no complaints at this time Has made dietary changes without improvements in BP CMP and CBC ordered

## 2021-10-01 NOTE — Assessment & Plan Note (Signed)

## 2021-10-01 NOTE — Assessment & Plan Note (Signed)
Low risk screen Treatable, and curable. If left untreated Hep C can lead to cirrhosis and liver failure. Encourage routine testing; recommend repeat testing if risk factors change.  

## 2021-10-01 NOTE — Assessment & Plan Note (Signed)
Repeat A1c and urine microalbumin Reports he has all the medications at this time Currently taking metformin 1000 mg BID and 70/30 at 20 units per day Does not have an eye doctor d/t lack of insurance

## 2021-10-02 LAB — CBC WITH DIFFERENTIAL/PLATELET
Basophils Absolute: 0.1 10*3/uL (ref 0.0–0.2)
Basos: 0 %
EOS (ABSOLUTE): 0.2 10*3/uL (ref 0.0–0.4)
Eos: 2 %
Hematocrit: 44 % (ref 37.5–51.0)
Hemoglobin: 15.3 g/dL (ref 13.0–17.7)
Immature Grans (Abs): 0.1 10*3/uL (ref 0.0–0.1)
Immature Granulocytes: 1 %
Lymphocytes Absolute: 2.2 10*3/uL (ref 0.7–3.1)
Lymphs: 19 %
MCH: 28.6 pg (ref 26.6–33.0)
MCHC: 34.8 g/dL (ref 31.5–35.7)
MCV: 82 fL (ref 79–97)
Monocytes Absolute: 0.7 10*3/uL (ref 0.1–0.9)
Monocytes: 6 %
Neutrophils Absolute: 8.3 10*3/uL — ABNORMAL HIGH (ref 1.4–7.0)
Neutrophils: 72 %
Platelets: 267 10*3/uL (ref 150–450)
RBC: 5.35 x10E6/uL (ref 4.14–5.80)
RDW: 13.2 % (ref 11.6–15.4)
WBC: 11.5 10*3/uL — ABNORMAL HIGH (ref 3.4–10.8)

## 2021-10-02 LAB — LIPID PANEL
Chol/HDL Ratio: 3.9 ratio (ref 0.0–5.0)
Cholesterol, Total: 150 mg/dL (ref 100–199)
HDL: 38 mg/dL — ABNORMAL LOW (ref >39)
LDL Chol Calc (NIH): 87 mg/dL (ref 0–99)
Triglycerides: 142 mg/dL (ref 0–149)
VLDL Cholesterol Cal: 25 mg/dL (ref 5–40)

## 2021-10-02 LAB — COMPREHENSIVE METABOLIC PANEL
ALT: 37 IU/L (ref 0–44)
AST: 22 IU/L (ref 0–40)
Albumin/Globulin Ratio: 1.6 (ref 1.2–2.2)
Albumin: 4.1 g/dL (ref 4.1–5.1)
Alkaline Phosphatase: 87 IU/L (ref 44–121)
BUN/Creatinine Ratio: 18 (ref 9–20)
BUN: 15 mg/dL (ref 6–20)
Bilirubin Total: 0.4 mg/dL (ref 0.0–1.2)
CO2: 25 mmol/L (ref 20–29)
Calcium: 9.5 mg/dL (ref 8.7–10.2)
Chloride: 98 mmol/L (ref 96–106)
Creatinine, Ser: 0.82 mg/dL (ref 0.76–1.27)
Globulin, Total: 2.6 g/dL (ref 1.5–4.5)
Glucose: 244 mg/dL — ABNORMAL HIGH (ref 70–99)
Potassium: 4.5 mmol/L (ref 3.5–5.2)
Sodium: 137 mmol/L (ref 134–144)
Total Protein: 6.7 g/dL (ref 6.0–8.5)
eGFR: 120 mL/min/{1.73_m2} (ref >59)

## 2021-10-02 LAB — MICROALBUMIN / CREATININE URINE RATIO
Creatinine, Urine: 133.7 mg/dL
Microalb/Creat Ratio: 11 mg/g creat (ref 0–29)
Microalbumin, Urine: 14.3 ug/mL

## 2021-10-02 LAB — TSH+FREE T4
Free T4: 0.96 ng/dL (ref 0.82–1.77)
TSH: 3.44 u[IU]/mL (ref 0.450–4.500)

## 2021-10-02 LAB — HEPATITIS C ANTIBODY: Hep C Virus Ab: NONREACTIVE

## 2021-10-02 LAB — HEMOGLOBIN A1C
Est. average glucose Bld gHb Est-mCnc: 206 mg/dL
Hgb A1c MFr Bld: 8.8 % — ABNORMAL HIGH (ref 4.8–5.6)

## 2021-10-02 NOTE — Progress Notes (Signed)
A1c has improved; however, not at goal. Continue to recommend additional 70/30 to assist. Recommend increase to 25 units from 20 units. Continue to recommend balanced, lower carb meals. Smaller meal size, adding snacks. Choosing water as drink of choice and increasing purposeful exercise. Goal is <7% for A1c. Slight elevation in WBC- infection cells; however, no cold/viral symptoms reported. Continue to monitor symptoms. Gwyneth Sprout, [address] [place], [place] [address] [phone] (phone) [phone] (fax) [place]

## 2021-11-07 ENCOUNTER — Other Ambulatory Visit: Payer: Self-pay | Admitting: Family Medicine

## 2021-11-07 DIAGNOSIS — Z6841 Body Mass Index (BMI) 40.0 and over, adult: Secondary | ICD-10-CM

## 2021-11-07 DIAGNOSIS — E1165 Type 2 diabetes mellitus with hyperglycemia: Secondary | ICD-10-CM

## 2021-11-11 NOTE — Progress Notes (Unsigned)
I,Sulibeya S Dimas,acting as a Education administrator for Gwyneth Sprout, FNP.,have documented all relevant documentation on the behalf of Gwyneth Sprout, FNP,as directed by  Gwyneth Sprout, FNP while in the presence of Gwyneth Sprout, FNP.     Established patient visit   Patient: Jeremy Salas   DOB: 1990/12/12   31 y.o. Male  MRN: [id number] Visit Date: 11/12/2021  Today's healthcare provider: Gwyneth Sprout, FNP   Chief Complaint  Patient presents with   Hypertension   Subjective    HPI  Hypertension, follow-up  BP Readings from Last 3 Encounters:  11/12/21 121/83  10/01/21 (!) 150/100  05/16/21 (!) 147/103   Wt Readings from Last 3 Encounters:  11/12/21 (!) 309 lb 11.2 oz (140.5 kg)  10/01/21 (!) 309 lb (140.2 kg)  05/16/21 (!) 303 lb 8 oz (137.7 kg)     He was last seen for hypertension 6 weeks ago.  BP at that visit was 150/100. Management since that visit includes start hyzaar 100-25.  He reports excellent compliance with treatment. He is not having side effects.  He is following a Regular diet. He is exercising. He does smoke.  Use of agents associated with hypertension: none.   Outside blood pressures are not being checked. Symptoms: No chest pain No chest pressure  No palpitations No syncope  No dyspnea No orthopnea  No paroxysmal nocturnal dyspnea No lower extremity edema   Pertinent labs Lab Results  Component Value Date   CHOL 150 10/01/2021   HDL 38 (L) 10/01/2021   LDLCALC 87 10/01/2021   TRIG 142 10/01/2021   CHOLHDL 3.9 10/01/2021   Lab Results  Component Value Date   NA 137 10/01/2021   K 4.5 10/01/2021   CREATININE 0.82 10/01/2021   EGFR 120 10/01/2021   GLUCOSE 244 (H) 10/01/2021   TSH 3.440 10/01/2021     The ASCVD Risk score (Arnett DK, et al., 2019) failed to calculate for the following reasons:   The 2019 ASCVD risk score is only valid for ages 83 to  8  ---------------------------------------------------------------------------------------------------   Medications: Outpatient Medications Prior to Visit  Medication Sig   blood glucose meter kit and supplies Dispense based on patient and insurance preference. Use Once daily as directed. (FOR ICD-10 E11.9).   insulin NPH-regular Human (NOVOLIN 70/30 RELION) (70-30) 100 UNIT/ML injection Inject 20 Units into the skin 2 (two) times daily with a meal. Titrate to Fasting Blood Sugar to range of 85-120.   Insulin Syringe-Needle U-100 (RELION INSULIN SYR 0.5CC/30G) 30G X 5/16" 0.5 ML MISC Use with insulin 70/30   metFORMIN (GLUCOPHAGE-XR) 500 MG 24 hr tablet Take 2 tablets (1,000 mg total) by mouth 2 (two) times daily after a meal.   omeprazole (PRILOSEC OTC) 20 MG tablet Take 20 mg by mouth daily before breakfast.   [DISCONTINUED] losartan-hydrochlorothiazide (HYZAAR) 100-25 MG tablet Take 1 tablet by mouth daily.   No facility-administered medications prior to visit.    Review of Systems  Constitutional:  Negative for appetite change and fatigue.  Eyes:  Negative for visual disturbance.  Respiratory:  Negative for chest tightness and shortness of breath.   Cardiovascular:  Negative for chest pain and leg swelling.  Gastrointestinal:  Negative for abdominal pain and nausea.     Objective    BP 121/83 (BP Location: Left Arm, Patient Position: Sitting, Cuff Size: Large)   Pulse 93   Temp 98.7 F (37.1 C) (Oral)   Resp 16   Wt (!)  309 lb 11.2 oz (140.5 kg)   BMI 44.44 kg/m  BP Readings from Last 3 Encounters:  11/12/21 121/83  10/01/21 (!) 150/100  05/16/21 (!) 147/103   Wt Readings from Last 3 Encounters:  11/12/21 (!) 309 lb 11.2 oz (140.5 kg)  10/01/21 (!) 309 lb (140.2 kg)  05/16/21 (!) 303 lb 8 oz (137.7 kg)      Physical Exam Vitals and nursing note reviewed.  Constitutional:      Appearance: Normal appearance. He is obese.  HENT:     Head: Normocephalic and  atraumatic.  Eyes:     Pupils: Pupils are equal, round, and reactive to light.  Cardiovascular:     Rate and Rhythm: Normal rate and regular rhythm.     Pulses: Normal pulses.     Heart sounds: Normal heart sounds.  Pulmonary:     Effort: Pulmonary effort is normal.     Breath sounds: Normal breath sounds.  Musculoskeletal:        General: Normal range of motion.     Cervical back: Normal range of motion.  Skin:    General: Skin is warm and dry.     Capillary Refill: Capillary refill takes less than 2 seconds.  Neurological:     General: No focal deficit present.     Mental Status: He is alert and oriented to person, place, and time. Mental status is at baseline.  Psychiatric:        Mood and Affect: Mood normal.        Behavior: Behavior normal.        Thought Content: Thought content normal.        Judgment: Judgment normal.    No results found for any visits on 11/12/21.  Assessment & Plan     Problem List Items Addressed This Visit       Cardiovascular and Mediastinum   Hypertension associated with diabetes ([place]) - Primary    Chronic, improved At goal  Goal of <130/<80 Continue Hyzaar 100-25       Relevant Medications   losartan-hydrochlorothiazide (HYZAAR) 100-25 MG tablet     Other   Right leg pain    S/p R femur fracture in 2020 s/p rod placement Request for PRN medication to assist with pain/spasms       Relevant Medications   methocarbamol (ROBAXIN) 750 MG tablet    Return in about 11 weeks (around 01/28/2022) for T2DM management.      [name], FNP, have reviewed all documentation for this visit. The documentation on 11/12/21 for the exam, diagnosis, procedures, and orders are all accurate and complete.  Gwyneth Sprout, [place] [phone] (phone) [phone] (fax)  [place]

## 2021-11-12 ENCOUNTER — Ambulatory Visit (INDEPENDENT_AMBULATORY_CARE_PROVIDER_SITE_OTHER): Payer: Self-pay | Admitting: Family Medicine

## 2021-11-12 ENCOUNTER — Encounter: Payer: Self-pay | Admitting: Family Medicine

## 2021-11-12 VITALS — BP 121/83 | HR 93 | Temp 98.7°F | Resp 16 | Wt 309.7 lb

## 2021-11-12 DIAGNOSIS — E1159 Type 2 diabetes mellitus with other circulatory complications: Secondary | ICD-10-CM

## 2021-11-12 DIAGNOSIS — M79604 Pain in right leg: Secondary | ICD-10-CM | POA: Insufficient documentation

## 2021-11-12 DIAGNOSIS — I152 Hypertension secondary to endocrine disorders: Secondary | ICD-10-CM

## 2021-11-12 MED ORDER — LOSARTAN POTASSIUM-HCTZ 100-25 MG PO TABS: 1.0000 | ORAL_TABLET | Freq: Every day | ORAL | 0 refills | Status: DC

## 2021-11-12 MED ORDER — METHOCARBAMOL 750 MG PO TABS: 750.0000 mg | ORAL_TABLET | Freq: Three times a day (TID) | ORAL | 0 refills | Status: DC | PRN

## 2021-11-12 NOTE — Assessment & Plan Note (Signed)
S/p R femur fracture in 2020 s/p rod placement Request for PRN medication to assist with pain/spasms

## 2021-11-12 NOTE — Assessment & Plan Note (Signed)
Chronic, improved At goal  Goal of <130/<80 Continue Hyzaar 100-25

## 2021-11-16 ENCOUNTER — Other Ambulatory Visit: Payer: Self-pay | Admitting: Family Medicine

## 2021-11-16 DIAGNOSIS — E1165 Type 2 diabetes mellitus with hyperglycemia: Secondary | ICD-10-CM

## 2022-01-30 ENCOUNTER — Ambulatory Visit: Payer: Medicaid Other | Admitting: Family Medicine

## 2022-02-02 ENCOUNTER — Encounter: Payer: Self-pay | Admitting: Family Medicine

## 2022-02-02 ENCOUNTER — Ambulatory Visit: Payer: Medicaid Other | Admitting: Family Medicine

## 2022-02-02 VITALS — BP 135/86 | HR 104 | Temp 98.1°F | Wt 309.0 lb

## 2022-02-02 DIAGNOSIS — I152 Hypertension secondary to endocrine disorders: Secondary | ICD-10-CM

## 2022-02-02 DIAGNOSIS — E1165 Type 2 diabetes mellitus with hyperglycemia: Secondary | ICD-10-CM | POA: Diagnosis not present

## 2022-02-02 DIAGNOSIS — E1159 Type 2 diabetes mellitus with other circulatory complications: Secondary | ICD-10-CM | POA: Diagnosis not present

## 2022-02-02 DIAGNOSIS — E1140 Type 2 diabetes mellitus with diabetic neuropathy, unspecified: Secondary | ICD-10-CM | POA: Diagnosis not present

## 2022-02-02 DIAGNOSIS — Z794 Long term (current) use of insulin: Secondary | ICD-10-CM | POA: Diagnosis not present

## 2022-02-02 LAB — POCT GLYCOSYLATED HEMOGLOBIN (HGB A1C): Hemoglobin A1C: 11.2 % — AB (ref 4.0–5.6)

## 2022-02-02 MED ORDER — METFORMIN HCL ER 500 MG PO TB24: 1000.0000 mg | ORAL_TABLET | Freq: Two times a day (BID) | ORAL | 1 refills | Status: DC

## 2022-02-02 MED ORDER — "INSULIN SYRINGE 29G X 1/2"" 1 ML MISC": 1.0000 | Freq: Two times a day (BID) | 3 refills | Status: AC

## 2022-02-02 MED ORDER — LOSARTAN POTASSIUM-HCTZ 100-25 MG PO TABS: 1.0000 | ORAL_TABLET | Freq: Every day | ORAL | 3 refills | Status: DC

## 2022-02-02 MED ORDER — NOVOLIN 70/30 RELION (70-30) 100 UNIT/ML ~~LOC~~ SUSP: 30.0000 [IU] | Freq: Two times a day (BID) | SUBCUTANEOUS | 0 refills | Status: DC

## 2022-02-02 NOTE — Assessment & Plan Note (Signed)
Chronic, stable Body mass index is 44.34 kg/m.

## 2022-02-02 NOTE — Progress Notes (Signed)
I,Connie R Striblin,acting as a Education administrator for Gwyneth Sprout, FNP.,have documented all relevant documentation on the behalf of Gwyneth Sprout, FNP,as directed by  Gwyneth Sprout, FNP while in the presence of Gwyneth Sprout, FNP.   Established patient visit   Patient: Jeremy Salas   DOB: [birth year]   32 y.o. Male  MRN: [id number] Visit Date: 02/02/2022  Today's healthcare provider: Gwyneth Sprout, FNP  Introduced to nurse practitioner role and practice setting.  All questions answered.  Discussed provider/patient relationship and expectations.  Subjective    HPI  Diabetes Mellitus Type II, Follow-up  Lab Results  Component Value Date   HGBA1C 11.2 (A) 02/02/2022   HGBA1C 8.8 (H) 10/01/2021   HGBA1C 10.3 (H) 05/07/2021   Wt Readings from Last 3 Encounters:  02/02/22 (!) 309 lb (140.2 kg)  11/12/21 (!) 309 lb 11.2 oz (140.5 kg)  10/01/21 (!) 309 lb (140.2 kg)   Last seen for diabetes 6 months ago.  Management since then includes continuing. He reports excellent compliance with treatment. He is not having side effects.   Home blood sugar records:  not being checked  Episodes of hypoglycemia? No   Pertinent Labs: Lab Results  Component Value Date   CHOL 150 10/01/2021   HDL 38 (L) 10/01/2021   LDLCALC 87 10/01/2021   TRIG 142 10/01/2021   CHOLHDL 3.9 10/01/2021   Lab Results  Component Value Date   NA 137 10/01/2021   K 4.5 10/01/2021   CREATININE 0.82 10/01/2021   EGFR 120 10/01/2021   LABMICR 14.3 10/01/2021   MICRALBCREAT 11 10/01/2021     ---------------------------------------------------------------------------------------------------   Medications: Outpatient Medications Prior to Visit  Medication Sig   blood glucose meter kit and supplies Dispense based on patient and insurance preference. Use Once daily as directed. (FOR ICD-10 E11.9).   methocarbamol (ROBAXIN) 750 MG tablet Take 1 tablet (750 mg total) by mouth every 8 (eight) hours as needed for muscle  spasms.   omeprazole (PRILOSEC OTC) 20 MG tablet Take 20 mg by mouth daily before breakfast.   [DISCONTINUED] insulin NPH-regular Human (NOVOLIN 70/30 RELION) (70-30) 100 UNIT/ML injection Inject 20 Units into the skin 2 (two) times daily with a meal. Titrate to Fasting Blood Sugar to range of 85-120.   [DISCONTINUED] Insulin Syringe-Needle U-100 (RELION INSULIN SYR 0.5CC/30G) 30G X 5/16" 0.5 ML MISC Use with insulin 70/30   [DISCONTINUED] losartan-hydrochlorothiazide (HYZAAR) 100-25 MG tablet Take 1 tablet by mouth daily.   [DISCONTINUED] metFORMIN (GLUCOPHAGE-XR) 500 MG 24 hr tablet TAKE TWO TABLETS BY MOUTH TWICE A DAY AFTER A MEAL   No facility-administered medications prior to visit.    Review of Systems    Objective    BP 135/86   Pulse (!) 104   Temp 98.1 F (36.7 C) (Oral)   Wt (!) 309 lb (140.2 kg)   SpO2 96%   BMI 44.34 kg/m   Physical Exam Vitals and nursing note reviewed.  Constitutional:      Appearance: Normal appearance. He is obese.  HENT:     Head: Normocephalic and atraumatic.  Eyes:     Pupils: Pupils are equal, round, and reactive to light.  Cardiovascular:     Rate and Rhythm: Regular rhythm. Tachycardia present.     Pulses: Normal pulses.     Heart sounds: Normal heart sounds.  Pulmonary:     Effort: Pulmonary effort is normal.     Breath sounds: Normal breath sounds.  Musculoskeletal:  General: Normal range of motion.     Cervical back: Normal range of motion.  Skin:    General: Skin is warm and dry.     Capillary Refill: Capillary refill takes less than 2 seconds.  Neurological:     General: No focal deficit present.     Mental Status: He is alert and oriented to person, place, and time. Mental status is at baseline.  Psychiatric:        Mood and Affect: Mood normal.        Behavior: Behavior normal.        Thought Content: Thought content normal.        Judgment: Judgment normal.      Results for orders placed or performed in visit  on 02/02/22  POCT HgB A1C  Result Value Ref Range   Hemoglobin A1C 11.2 (A) 4.0 - 5.6 %   HbA1c POC (<> result, manual entry)     HbA1c, POC (prediabetic range)     HbA1c, POC (controlled diabetic range)      Assessment & Plan     Problem List Items Addressed This Visit       Cardiovascular and Mediastinum   Hypertension associated with diabetes ([place])    Chronic, borderline elevation Goal <130/<80 Pt reports running out of his hyzaar Refill provided Also has gotten insurance to assist with drug costs Encouraged to come back in 1 month after consistent use of medication; if BP have stabilized- encouraged to reach out online or via phone with readings and OK to cancel appt       Relevant Medications   insulin NPH-regular Human (NOVOLIN 70/30 RELION) (70-30) 100 UNIT/ML injection   losartan-hydrochlorothiazide (HYZAAR) 100-25 MG tablet   metFORMIN (GLUCOPHAGE-XR) 500 MG 24 hr tablet     Endocrine   Type 2 diabetes mellitus with diabetic neuropathy, with long-term current use of insulin (HCC) - Primary    Chronic, worsening Continue to recommend balanced, lower carb meals. Smaller meal size, adding snacks. Choosing water as drink of choice and increasing purposeful exercise. Increase insulin from 25 per pt report to 30 units BID RTC in 3 months       Relevant Medications   insulin NPH-regular Human (NOVOLIN 70/30 RELION) (70-30) 100 UNIT/ML injection   losartan-hydrochlorothiazide (HYZAAR) 100-25 MG tablet   metFORMIN (GLUCOPHAGE-XR) 500 MG 24 hr tablet   Other Relevant Orders   POCT HgB A1C (Completed)   Uncontrolled type 2 diabetes mellitus with hyperglycemia (HCC)    Chronic, worsening Continue metformin Continue insulin If not improved in 3 months- recommend additional agent Goal <7%      Relevant Medications   insulin NPH-regular Human (NOVOLIN 70/30 RELION) (70-30) 100 UNIT/ML injection   losartan-hydrochlorothiazide (HYZAAR) 100-25 MG tablet   metFORMIN  (GLUCOPHAGE-XR) 500 MG 24 hr tablet     Other   Morbid obesity (HCC)    Chronic, stable Body mass index is 44.34 kg/m.       Relevant Medications   insulin NPH-regular Human (NOVOLIN 70/30 RELION) (70-30) 100 UNIT/ML injection   metFORMIN (GLUCOPHAGE-XR) 500 MG 24 hr tablet   Return in about 4 weeks (around 03/02/2022) for HTN management, blood pressure check.     [name], FNP, have reviewed all documentation for this visit. The documentation on 02/02/22 for the exam, diagnosis, procedures, and orders are all accurate and complete.  Gwyneth Sprout, [place] [phone] (phone) [phone] (fax)  [place]

## 2022-02-02 NOTE — Assessment & Plan Note (Signed)
Chronic, worsening Continue to recommend balanced, lower carb meals. Smaller meal size, adding snacks. Choosing water as drink of choice and increasing purposeful exercise. Increase insulin from 25 per pt report to 30 units BID RTC in 3 months

## 2022-02-02 NOTE — Assessment & Plan Note (Signed)
Chronic, worsening Continue metformin Continue insulin If not improved in 3 months- recommend additional agent Goal <7%

## 2022-02-02 NOTE — Assessment & Plan Note (Signed)
Chronic, borderline elevation Goal <130/<80 Pt reports running out of his hyzaar Refill provided Also has gotten insurance to assist with drug costs Encouraged to come back in 1 month after consistent use of medication; if BP have stabilized- encouraged to reach out online or via phone with readings and OK to cancel appt

## 2022-02-11 ENCOUNTER — Other Ambulatory Visit: Payer: Self-pay | Admitting: Family Medicine

## 2022-02-11 DIAGNOSIS — E1140 Type 2 diabetes mellitus with diabetic neuropathy, unspecified: Secondary | ICD-10-CM

## 2022-02-11 DIAGNOSIS — E1159 Type 2 diabetes mellitus with other circulatory complications: Secondary | ICD-10-CM

## 2022-02-11 DIAGNOSIS — E1165 Type 2 diabetes mellitus with hyperglycemia: Secondary | ICD-10-CM

## 2022-02-20 ENCOUNTER — Telehealth: Payer: Self-pay

## 2022-02-20 NOTE — Progress Notes (Signed)
   Care Guide Note  02/20/2022 Name: ALVON HEINBAUGH MRN: [id number] DOB: March 17, 1990  Referred by: Gwyneth Sprout, FNP Reason for referral : Care Coordination (Outreach to schedule with pharm d )   BECKY MCKINZIE is a 32 y.o. year old male who is a primary care patient of Gwyneth Sprout, FNP. Lockie Pares was referred to the pharmacist for assistance related to DM.    Successful contact was made with the patient to discuss pharmacy services including being ready for the pharmacist to call at least 5 minutes before the scheduled appointment time, to have medication bottles and any blood sugar or blood pressure readings ready for review. The patient agreed to meet with the pharmacist via with the pharmacist via telephone visit on (date/time).  03/11/2022  Noreene Larsson, [place], [place] [address] Direct Dial: [phone] [email or web address]

## 2022-02-27 ENCOUNTER — Other Ambulatory Visit: Payer: Self-pay | Admitting: Family Medicine

## 2022-02-27 ENCOUNTER — Encounter: Payer: Self-pay | Admitting: Family Medicine

## 2022-02-27 ENCOUNTER — Telehealth: Payer: Self-pay

## 2022-02-27 MED ORDER — NOVOLIN 70/30 (70-30) 100 UNIT/ML ~~LOC~~ SUSP: 35.0000 [IU] | Freq: Two times a day (BID) | SUBCUTANEOUS | 12 refills | Status: DC

## 2022-02-27 NOTE — Telephone Encounter (Signed)
We have gotten the insulin figured out; should be called in and [facility] at [facility]. 35 units BID with meals

## 2022-03-03 ENCOUNTER — Ambulatory Visit: Payer: Medicaid Other | Admitting: Family Medicine

## 2022-03-09 ENCOUNTER — Telehealth: Payer: Self-pay | Admitting: Family Medicine

## 2022-03-11 ENCOUNTER — Other Ambulatory Visit: Payer: Medicaid Other | Admitting: Pharmacist

## 2022-03-11 NOTE — Patient Instructions (Signed)
Goals Addressed             This Visit's Progress    Pharmacy Goals       Our goal A1c is less than 7%. This corresponds with fasting sugars less than 130 and 2 hour after meal sugars less than 180. Please keep a log of your results when checking your blood sugar   Check your blood pressure once-twice weekly, and any time you have concerning symptoms like headache, chest pain, dizziness, shortness of breath, or vision changes.   Our goal is less than 130/80.  To appropriately check your blood pressure, make sure you do the following:  1) Avoid caffeine, exercise, or tobacco products for 30 minutes before checking. Empty your bladder. 2) Sit with your back supported in a flat-backed chair. Rest your arm on something flat (arm of the chair, table, etc). 3) Sit still with your feet flat on the floor, resting, for at least 5 minutes.  4) Check your blood pressure. Take 1-2 readings.  5) Write down these readings and bring with you to any provider appointments.  Bring your home blood pressure machine with you to a provider's office for accuracy comparison at least once a year.   Make sure you take your blood pressure medications before you come to any office visit, even if you were asked to fast for labs.  Wallace Cullens, PharmD, [facility] [phone]

## 2022-03-11 NOTE — Progress Notes (Signed)
03/11/2022 Name: Jeremy Salas MRN: [id number] DOB: Jul 16, 1990  Chief Complaint  Patient presents with   Medication Assistance   Medication Management    Jeremy Salas is a 32 y.o. year old male who presented for a telephone visit.   They were referred to the pharmacist by a quality report for assistance in managing diabetes and medication access.   Patient is participating in a Managed Medicaid Plan:  Yes, [employer] [place] [place]  Subjective:  Care Team: Primary Care Provider: Gwyneth Sprout, FNP ; Next Scheduled Visit: 05/04/2022  Medication Access/Adherence  Current Pharmacy:  [facility] [place], [place] [address] AT [address] [place] [address] Phone: [phone] Fax: [phone]   Patient reports affordability concerns with their medications: No  Patient reports access/transportation concerns to their pharmacy: No  Patient reports adherence concerns with their medications:  No     Diabetes:  Current medications:  Metformin ER 500 mg - 2 tablets (1000 mg) twice daily Novolin 70/30 - 30 units twice daily (reports increased to this dose as directed by PCP on 1/29)  Reports currently only checking blood sugar a couple of times/week  Does not currently keep log of his readings, but recalls last checked a couple of days ago ~1 hour after a meal, reading ~189  Patient denies hypoglycemic s/sx including dizziness, shakiness, sweating.   Current meal patterns:  - Breakfast: cereal or oatmeal with fruit  - Lunch: sandwich  - Supper: chicken or pork chop with broccoli or green beans - Drink: water and Zero Sugar [place] Dry - Snack: Granola bars, apples & peanut butter  Reports currently purchases his Novolin 70/30 without insurance at [place] and this is affordable to him.    Hypertension:  Current medications: losartan-HCTZ 100-25 mg daily  Patient does not have an automated, upper arm home BP cuff;  reports sometimes checks his blood pressure at [facility] Recall blood pressure readings readings last week ranged: 125-136/79-87    Objective:  Lab Results  Component Value Date   HGBA1C 11.2 (A) 02/02/2022    Lab Results  Component Value Date   CREATININE 0.82 10/01/2021   BUN 15 10/01/2021   NA 137 10/01/2021   K 4.5 10/01/2021   CL 98 10/01/2021   CO2 25 10/01/2021    Lab Results  Component Value Date   CHOL 150 10/01/2021   HDL 38 (L) 10/01/2021   LDLCALC 87 10/01/2021   TRIG 142 10/01/2021   CHOLHDL 3.9 10/01/2021   BP Readings from Last 3 Encounters:  02/02/22 135/86  11/12/21 121/83  10/01/21 (!) 150/100   Pulse Readings from Last 3 Encounters:  02/02/22 (!) 104  11/12/21 93  10/01/21 87     Medications Reviewed Today     Reviewed by Rennis Petty, RPH-CPP (Pharmacist) on 03/11/22 at 15  Med List Status: <None>   Medication Order Taking? Sig Documenting Provider Last Dose Status Informant  blood glucose meter kit and supplies [id number]  Dispense based on patient and insurance preference. Use Once daily as directed. (FOR ICD-10 E11.9). Eulas Post, MD  Active   insulin NPH-regular Human (NOVOLIN 70/30) (70-30) 100 UNIT/ML injection [id number] Yes Inject 35 Units into the skin 2 (two) times daily with a meal.  Patient taking differently: Inject 30 Units into the skin 2 (two) times daily with a meal.   Gwyneth Sprout, FNP Taking Active   INSULIN SYRINGE 1CC/29G 29G X 1/2"  1 ML MISC [id number]  1 each by Does not apply route in the morning and at bedtime. [name] T, FNP  Active   losartan-hydrochlorothiazide [facility]) 100-25 MG tablet [id number] Yes Take 1 tablet by mouth daily. Gwyneth Sprout, FNP Taking Active   metFORMIN (GLUCOPHAGE-XR) 500 MG 24 hr tablet [id number] Yes Take 2 tablets (1,000 mg total) by mouth 2 (two) times daily with a meal. Gwyneth Sprout, FNP Taking Active   methocarbamol (ROBAXIN) 750 MG tablet [id number]  Take 1 tablet  (750 mg total) by mouth every 8 (eight) hours as needed for muscle spasms. Gwyneth Sprout, FNP  Active   omeprazole (PRILOSEC OTC) 20 MG tablet [id number]  Take 20 mg by mouth daily before breakfast. [provider]  Active               Assessment/Plan:   Diabetes: - Currently uncontrolled - Reviewed long term cardiovascular and renal outcomes of uncontrolled blood sugar - Reviewed dietary modifications including importance of having regular well-balanced meals while controlling carbohydrate portion sizes - Counsel patient on s/s of low blood sugar and how to treat lows Review rules of 15s - importance of using 15 grams of sugar to treat low, recheck blood sugar in 15 minutes, treat again if remains low or, if back to normal, having meal if mealtime or snack  Review examples of sources of 15 grams of sugar - Recommend to check glucose daily and anytime experiencing symptoms such as dizziness, shaking sweating, keep a log of the results and have this record to review during medical appointments - From review of Medicaid formulary, note Humulin 70/30 preferred formulary alternative to Novolin 70/30 Offer to collaborate with PCP to request a change for cost savings, but patient states that he instead prefers to just purchase the Novolin 70/30 without insurance   Hypertension: - Reviewed long term cardiovascular and renal outcomes of uncontrolled blood pressure - Encourage patient to continue to monitor blood pressure when able and contact PCP if needed for readings outside of established parameters or symptoms    Follow Up Plan: Clinical Pharmacist will follow up with patient by telephone on 06/03/2022 at 3:00 PM   Wallace Cullens, PharmD, [facility] [phone]

## 2022-03-25 NOTE — Telephone Encounter (Signed)
Reports currently purchases his Novolin 70/30 without insurance at [place] and this is affordable to him.   PA is not needed.

## 2022-05-01 NOTE — Progress Notes (Unsigned)
Jeremy Salas,Jeremy Salas,acting as a scribe for Jacky Kindle, FNP.,have documented all relevant documentation on the behalf of Jacky Kindle, FNP,as directed by  Jacky Kindle, FNP while in the presence of Jacky Kindle, FNP.   Established patient visit   Patient: Jeremy Salas   DOB: [birth year]   32 y.o. Male  MRN: [id number] Visit Date: 05/04/2022  Today's healthcare provider: Jacky Kindle, FNP   Chief Complaint  Patient presents with   Diabetes   Subjective    HPI  Diabetes Mellitus Type II, follow-up  Lab Results  Component Value Date   HGBA1C 10.2 (A) 05/04/2022   HGBA1C 11.2 (A) 02/02/2022   HGBA1C 8.8 (H) 10/01/2021   Last seen for diabetes 3 months ago.  Management since then includes Increase insulin from 25 per pt report to 30 units BID. He reports good compliance with treatment. He is not having side effects.   Home blood sugar records:  not being measured  Episodes of hypoglycemia? No    Current insulin regiment: Novolin Most Recent Eye Exam: Not scheduled   --------------------------------------------------------------------------------------------------- Hypertension, follow-up  BP Readings from Last 3 Encounters:  05/04/22 128/86  02/02/22 135/86  11/12/21 121/83   Wt Readings from Last 3 Encounters:  05/04/22 (!) 304 lb 4.8 oz (138 kg)  02/02/22 (!) 309 lb (140.2 kg)  11/12/21 (!) 309 lb 11.2 oz (140.5 kg)     He was last seen for hypertension 3 months ago.  BP at that visit was 135/86. Management since that visit includes none. He reports fair compliance with treatment. He is not having side effects.   Outside blood pressures are not being recorded      Medications: Outpatient Medications Prior to Visit  Medication Sig   blood glucose meter kit and supplies Dispense based on patient and insurance preference. Use Once daily as directed. (FOR ICD-10 E11.9).   insulin NPH-regular Human (NOVOLIN 70/30) (70-30) 100 UNIT/ML injection Inject  35 Units into the skin 2 (two) times daily with a meal. (Patient taking differently: Inject 30 Units into the skin 2 (two) times daily with a meal.)   INSULIN SYRINGE 1CC/29G 29G X 1/2" 1 ML MISC 1 each by Does not apply route in the morning and at bedtime.   losartan-hydrochlorothiazide (HYZAAR) 100-25 MG tablet Take 1 tablet by mouth daily.   metFORMIN (GLUCOPHAGE-XR) 500 MG 24 hr tablet Take 2 tablets (1,000 mg total) by mouth 2 (two) times daily with a meal.   methocarbamol (ROBAXIN) 750 MG tablet Take 1 tablet (750 mg total) by mouth every 8 (eight) hours as needed for muscle spasms.   omeprazole (PRILOSEC OTC) 20 MG tablet Take 20 mg by mouth daily before breakfast.   No facility-administered medications prior to visit.    Review of Systems    Objective    BP 128/86 (BP Location: Right Arm, Patient Position: Sitting, Cuff Size: Large)   Pulse 85   Temp 98.9 F (37.2 C) (Oral)   Resp 11   Ht 5\' 10"  (1.778 m)   Wt (!) 304 lb 4.8 oz (138 kg)   SpO2 97%   BMI 43.66 kg/m   Physical Exam Vitals and nursing note reviewed.  Constitutional:      Appearance: Normal appearance. He is obese.  HENT:     Head: Normocephalic and atraumatic.  Cardiovascular:     Rate and Rhythm: Normal rate and regular rhythm.     Pulses: Normal pulses.  Heart sounds: Normal heart sounds.  Pulmonary:     Effort: Pulmonary effort is normal.     Breath sounds: Normal breath sounds.  Musculoskeletal:        General: Normal range of motion.     Cervical back: Normal range of motion.  Skin:    General: Skin is warm and dry.     Capillary Refill: Capillary refill takes less than 2 seconds.  Neurological:     General: No focal deficit present.     Mental Status: He is alert and oriented to person, place, and time. Mental status is at baseline.  Psychiatric:        Mood and Affect: Mood normal.        Behavior: Behavior normal.        Thought Content: Thought content normal.        Judgment:  Judgment normal.      Results for orders placed or performed in visit on 05/04/22  POCT glycosylated hemoglobin (Hb A1C)  Result Value Ref Range   Hemoglobin A1C     HbA1c POC (<> result, manual entry) 10.2 (A) 4.0 - 5.6 %   HbA1c, POC (prediabetic range)     HbA1c, POC (controlled diabetic range)      Assessment & Plan     Problem List Items Addressed This Visit       Cardiovascular and Mediastinum   Hypertension associated with diabetes (HCC)    Chronic, borderline Continue to recommend MJ smoking reduction to assist in meeting BP goal of 119/79  Continue heart healthy diet, exercise and hyzaar 100-25        Endocrine   Type 2 diabetes mellitus with diabetic neuropathy, with long-term current use of insulin (HCC) - Primary    Chronic, remains elevated A1c goal of <7% 1% reduction noted Continue to recommend balanced, lower carb meals. Smaller meal size, adding snacks. Choosing water as drink of choice and increasing purposeful exercise. Pt notes decreased compliance in PM dosing of both metformin and insulin Encouraged to set timer on phone to assist  3 month f/u recommended       Relevant Orders   POCT glycosylated hemoglobin (Hb A1C) (Completed)     Other   Morbid obesity (HCC)    5# weight loss noted Body mass index is 43.66 kg/m. Discussed importance of healthy weight management Discussed diet and exercise       Return in about 3 months (around 08/03/2022) for HTN management.     Leilani Merl, FNP, have reviewed all documentation for this visit. The documentation on 05/04/22 for the exam, diagnosis, procedures, and orders are all accurate and complete.  Jacky Kindle, FNP  [facility] [phone] (phone) [phone] (fax)  [facility]

## 2022-05-04 ENCOUNTER — Encounter: Payer: Self-pay | Admitting: Family Medicine

## 2022-05-04 ENCOUNTER — Ambulatory Visit: Payer: Medicaid Other | Admitting: Family Medicine

## 2022-05-04 VITALS — BP 128/86 | HR 85 | Temp 98.9°F | Resp 11 | Ht 70.0 in | Wt 304.3 lb

## 2022-05-04 DIAGNOSIS — I152 Hypertension secondary to endocrine disorders: Secondary | ICD-10-CM

## 2022-05-04 DIAGNOSIS — E1140 Type 2 diabetes mellitus with diabetic neuropathy, unspecified: Secondary | ICD-10-CM

## 2022-05-04 DIAGNOSIS — E1159 Type 2 diabetes mellitus with other circulatory complications: Secondary | ICD-10-CM | POA: Diagnosis not present

## 2022-05-04 DIAGNOSIS — Z794 Long term (current) use of insulin: Secondary | ICD-10-CM

## 2022-05-04 LAB — POCT GLYCOSYLATED HEMOGLOBIN (HGB A1C): HbA1c POC (<> result, manual entry): 10.2 % — AB (ref 4.0–5.6)

## 2022-05-04 NOTE — Assessment & Plan Note (Signed)
5# weight loss noted Body mass index is 43.66 kg/m. Discussed importance of healthy weight management Discussed diet and exercise

## 2022-05-04 NOTE — Assessment & Plan Note (Signed)
Chronic, borderline Continue to recommend MJ smoking reduction to assist in meeting BP goal of 119/79  Continue heart healthy diet, exercise and hyzaar 100-25

## 2022-05-04 NOTE — Assessment & Plan Note (Signed)
Chronic, remains elevated A1c goal of <7% 1% reduction noted Continue to recommend balanced, lower carb meals. Smaller meal size, adding snacks. Choosing water as drink of choice and increasing purposeful exercise. Pt notes decreased compliance in PM dosing of both metformin and insulin Encouraged to set timer on phone to assist  3 month f/u recommended

## 2022-05-14 ENCOUNTER — Telehealth: Payer: Self-pay

## 2022-05-14 NOTE — Progress Notes (Signed)
   Care Guide Note  05/14/2022 Name: Jeremy Salas MRN: [id number] DOB: 26-Jun-1990  Referred by: Jacky Kindle, FNP Reason for referral : Care Coordination (Outreach to reschedule f/u with Pharm d )   Jeremy Salas is a 32 y.o. year old male who is a primary care patient of Jacky Kindle, FNP. Jeremy Salas was referred to the pharmacist for assistance related to DM.    An unsuccessful telephone outreach was attempted today to contact the patient who was referred to the pharmacy team for assistance with medication management. Additional attempts will be made to contact the patient.   Penne Lash, RMA Care Guide [facility]  [place], [place] [address] Direct Dial: [phone] [email or web address]

## 2022-06-03 ENCOUNTER — Other Ambulatory Visit: Payer: Medicaid Other | Admitting: Pharmacist

## 2022-06-11 NOTE — Progress Notes (Signed)
  Care Coordination Note  06/11/2022 Name: [name] MRN: [id number] DOB: 1990-08-21  Jeremy Salas is a 32 y.o. year old male who is a primary care patient of Jacky Kindle, FNP and is actively engaged with the Chronic Care Management team. I reached out to Orvilla Cornwall by phone today to assist with re-scheduling a follow up visit with the Pharmacist  Follow up plan: Patient declines further follow up and engagement by the care management team. Appropriate care team members and provider have been notified via electronic communication.   Penne Lash, RMA Care Guide [facility]  [place], [place] [address] Direct Dial: [phone] [email or web address]

## 2022-07-08 DIAGNOSIS — E113291 Type 2 diabetes mellitus with mild nonproliferative diabetic retinopathy without macular edema, right eye: Secondary | ICD-10-CM | POA: Diagnosis not present

## 2022-07-08 LAB — HM DIABETES EYE EXAM

## 2022-07-14 ENCOUNTER — Encounter: Payer: Self-pay | Admitting: Family Medicine

## 2022-09-24 ENCOUNTER — Ambulatory Visit: Payer: Medicaid Other | Admitting: Family Medicine

## 2022-09-24 ENCOUNTER — Encounter: Payer: Self-pay | Admitting: Family Medicine

## 2022-09-24 VITALS — BP 128/88 | HR 80 | Ht 70.0 in | Wt 300.7 lb

## 2022-09-24 DIAGNOSIS — E1159 Type 2 diabetes mellitus with other circulatory complications: Secondary | ICD-10-CM

## 2022-09-24 DIAGNOSIS — E1140 Type 2 diabetes mellitus with diabetic neuropathy, unspecified: Secondary | ICD-10-CM | POA: Diagnosis not present

## 2022-09-24 DIAGNOSIS — Z794 Long term (current) use of insulin: Secondary | ICD-10-CM | POA: Diagnosis not present

## 2022-09-24 DIAGNOSIS — I152 Hypertension secondary to endocrine disorders: Secondary | ICD-10-CM

## 2022-09-24 LAB — POCT GLYCOSYLATED HEMOGLOBIN (HGB A1C): Hemoglobin A1C: 7.7 % — AB (ref 4.0–5.6)

## 2022-09-24 NOTE — Assessment & Plan Note (Signed)
Chronic, much improved A1c at goal <8%; continue to recommend <7% to assist with secondary complications Continue to recommend balanced, lower carb meals. Smaller meal size, adding snacks. Choosing water as drink of choice and increasing purposeful exercise. Continue 1000 mg BID metformin and 70/30 at 30 units BID

## 2022-09-24 NOTE — Assessment & Plan Note (Signed)
Chronic, remains borderline Goal of 129/79 Continue to focus on diet/exercise and use of medication Defer med changes given previous weight loss and improved A1c Remains on hyzaar 100-25

## 2022-09-24 NOTE — Assessment & Plan Note (Signed)
Chronic, improved Body mass index is 43.15 kg/m. Discussed importance of healthy weight management Discussed diet and exercise

## 2022-09-24 NOTE — Progress Notes (Signed)
Established patient visit   Patient: Jeremy Salas   DOB: Dec 02, 1990   32 y.o. Male  MRN: [id number] Visit Date: 09/24/2022  Today's healthcare provider: Jacky Kindle, FNP  Introduced to nurse practitioner role and practice setting.  All questions answered.  Discussed provider/patient relationship and expectations.  Subjective    HPI HPI     Medical Management of Chronic Issues    Additional comments: Would like to have A1C and BP checked       Last edited by Rolly Salter, CMA on 09/24/2022 11:21 AM.      Medications: Outpatient Medications Prior to Visit  Medication Sig   blood glucose meter kit and supplies Dispense based on patient and insurance preference. Use Once daily as directed. (FOR ICD-10 E11.9).   insulin NPH-regular Human (NOVOLIN 70/30) (70-30) 100 UNIT/ML injection Inject 35 Units into the skin 2 (two) times daily with a meal. (Patient taking differently: Inject 30 Units into the skin 2 (two) times daily with a meal.)   INSULIN SYRINGE 1CC/29G 29G X 1/2" 1 ML MISC 1 each by Does not apply route in the morning and at bedtime.   losartan-hydrochlorothiazide (HYZAAR) 100-25 MG tablet Take 1 tablet by mouth daily.   metFORMIN (GLUCOPHAGE-XR) 500 MG 24 hr tablet Take 2 tablets (1,000 mg total) by mouth 2 (two) times daily with a meal.   methocarbamol (ROBAXIN) 750 MG tablet Take 1 tablet (750 mg total) by mouth every 8 (eight) hours as needed for muscle spasms.   omeprazole (PRILOSEC OTC) 20 MG tablet Take 20 mg by mouth daily before breakfast.   No facility-administered medications prior to visit.     Objective    BP 128/88   Pulse 80   Ht 5\' 10"  (1.778 m)   Wt (!) 300 lb 11.2 oz (136.4 kg)   BMI 43.15 kg/m   Physical Exam Vitals and nursing note reviewed.  Constitutional:      Appearance: Normal appearance. He is obese.  HENT:     Head: Normocephalic and atraumatic.  Cardiovascular:     Rate and Rhythm: Normal rate and regular rhythm.      Pulses: Normal pulses.     Heart sounds: Normal heart sounds.  Pulmonary:     Effort: Pulmonary effort is normal.     Breath sounds: Normal breath sounds.  Musculoskeletal:        General: Normal range of motion.     Cervical back: Normal range of motion.  Skin:    General: Skin is warm and dry.     Capillary Refill: Capillary refill takes less than 2 seconds.  Neurological:     General: No focal deficit present.     Mental Status: He is alert and oriented to person, place, and time. Mental status is at baseline.  Psychiatric:        Mood and Affect: Mood normal.        Behavior: Behavior normal.        Thought Content: Thought content normal.        Judgment: Judgment normal.       Results for orders placed or performed in visit on 09/24/22  POCT HgB A1C  Result Value Ref Range   Hemoglobin A1C 7.7 (A) 4.0 - 5.6 %   HbA1c POC (<> result, manual entry)     HbA1c, POC (prediabetic range)     HbA1c, POC (controlled diabetic range)      Assessment & Plan  Problem List Items Addressed This Visit       Cardiovascular and Mediastinum   Hypertension associated with diabetes (HCC)    Chronic, remains borderline Goal of 129/79 Continue to focus on diet/exercise and use of medication Defer med changes given previous weight loss and improved A1c Remains on hyzaar 100-25        Endocrine   Type 2 diabetes mellitus with diabetic neuropathy, with long-term current use of insulin (HCC) - Primary    Chronic, much improved A1c at goal <8%; continue to recommend <7% to assist with secondary complications Continue to recommend balanced, lower carb meals. Smaller meal size, adding snacks. Choosing water as drink of choice and increasing purposeful exercise. Continue 1000 mg BID metformin and 70/30 at 30 units BID      Relevant Orders   POCT HgB A1C (Completed)     Other   Morbid obesity (HCC)    Chronic, improved Body mass index is 43.15 kg/m. Discussed importance of  healthy weight management Discussed diet and exercise       Return in about 3 months (around 12/24/2022) for annual examination.     Leilani Merl, FNP, have reviewed all documentation for this visit. The documentation on 09/24/22 for the exam, diagnosis, procedures, and orders are all accurate and complete.  Jacky Kindle, FNP  [facility] [phone] (phone) [phone] (fax)  [facility]

## 2022-12-01 ENCOUNTER — Other Ambulatory Visit: Payer: Self-pay

## 2022-12-01 ENCOUNTER — Encounter: Payer: Self-pay | Admitting: Emergency Medicine

## 2022-12-01 ENCOUNTER — Emergency Department
Admission: EM | Admit: 2022-12-01 | Discharge: 2022-12-01 | Disposition: A | Discharge status: Home / Self Care | Payer: Medicaid Other | Attending: Emergency Medicine | Admitting: Emergency Medicine

## 2022-12-01 DIAGNOSIS — Z4800 Encounter for change or removal of nonsurgical wound dressing: Secondary | ICD-10-CM | POA: Insufficient documentation

## 2022-12-01 DIAGNOSIS — W260XXD Contact with knife, subsequent encounter: Secondary | ICD-10-CM | POA: Insufficient documentation

## 2022-12-01 DIAGNOSIS — Z5189 Encounter for other specified aftercare: Secondary | ICD-10-CM

## 2022-12-01 DIAGNOSIS — Z4801 Encounter for change or removal of surgical wound dressing: Secondary | ICD-10-CM | POA: Diagnosis not present

## 2022-12-01 DIAGNOSIS — I10 Essential (primary) hypertension: Secondary | ICD-10-CM | POA: Diagnosis not present

## 2022-12-01 DIAGNOSIS — S61412D Laceration without foreign body of left hand, subsequent encounter: Secondary | ICD-10-CM | POA: Diagnosis not present

## 2022-12-01 DIAGNOSIS — E119 Type 2 diabetes mellitus without complications: Secondary | ICD-10-CM | POA: Diagnosis not present

## 2022-12-01 HISTORY — DX: Essential (primary) hypertension: I10

## 2022-12-01 LAB — BASIC METABOLIC PANEL
Anion gap: 6 (ref 5–15)
BUN: 13 mg/dL (ref 6–20)
CO2: 27 mmol/L (ref 22–32)
Calcium: 8.6 mg/dL — ABNORMAL LOW (ref 8.9–10.3)
Chloride: 101 mmol/L (ref 98–111)
Creatinine, Ser: 0.75 mg/dL (ref 0.61–1.24)
GFR, Estimated: >60 mL/min (ref >60)
Glucose, Bld: 345 mg/dL — ABNORMAL HIGH (ref 70–99)
Potassium: 3.8 mmol/L (ref 3.5–5.1)
Sodium: 134 mmol/L — ABNORMAL LOW (ref 135–145)

## 2022-12-01 LAB — CBC WITH DIFFERENTIAL/PLATELET
Abs Immature Granulocytes: 0.05 10*3/uL (ref 0.00–0.07)
Basophils Absolute: 0 10*3/uL (ref 0.0–0.1)
Basophils Relative: 0 %
Eosinophils Absolute: 0.1 10*3/uL (ref 0.0–0.5)
Eosinophils Relative: 1 %
HCT: 40 % (ref 39.0–52.0)
Hemoglobin: 13.5 g/dL (ref 13.0–17.0)
Immature Granulocytes: 1 %
Lymphocytes Relative: 29 %
Lymphs Abs: 2.6 10*3/uL (ref 0.7–4.0)
MCH: 27.8 pg (ref 26.0–34.0)
MCHC: 33.8 g/dL (ref 30.0–36.0)
MCV: 82.5 fL (ref 80.0–100.0)
Monocytes Absolute: 0.6 10*3/uL (ref 0.1–1.0)
Monocytes Relative: 6 %
Neutro Abs: 5.6 10*3/uL (ref 1.7–7.7)
Neutrophils Relative %: 63 %
Platelets: 230 10*3/uL (ref 150–400)
RBC: 4.85 MIL/uL (ref 4.22–5.81)
RDW: 13.2 % (ref 11.5–15.5)
WBC: 8.9 10*3/uL (ref 4.0–10.5)
nRBC: 0 % (ref 0.0–0.2)

## 2022-12-01 MED ORDER — CEPHALEXIN 500 MG PO CAPS: 500.0000 mg | ORAL_CAPSULE | Freq: Four times a day (QID) | ORAL | 0 refills | Status: AC

## 2022-12-01 NOTE — Discharge Instructions (Addendum)
Please take the antibiotics as prescribed.  Do not use alcohol or hydrogen peroxide to clean your wound anymore as this damages the healing tissue.  Please wash daily with soap and water, pat dry and then apply triple antibiotic ointment and cover with a bandage.  You should start to see improvement after taking the antibiotic for a couple days.  Watch for signs of infection including redness, warmth, swelling, pain and pus drainage.  If you develop any of these please return to the ED, urgent care or your primary care provider.

## 2022-12-01 NOTE — ED Triage Notes (Signed)
Patient to ED via POV for wound to left hand. Pt reports cutting it 2 weeks ago with knife. Redness and swelling noted. Hx of diabetes.

## 2022-12-01 NOTE — ED Provider Notes (Signed)
[facility] Provider Note    Event Date/Time   First MD Initiated Contact with Patient 12/01/22 1739     (approximate)   History   Wound Check   HPI  Jeremy Salas is a 32 y.o. male with PMH of HTN and diabetes presents for a wound check.  Patient cut his hand 2 weeks ago with a kitchen knife when he was breaking down boxes.  He has been caring for the wound at home cleaning it with hydrogen peroxide and alcohol.  He was encouraged to come to the hospital by his mom who was concerned about a wound infection because of his diabetes.      Physical Exam   Triage Vital Signs: ED Triage Vitals  Encounter Vitals Group     BP 12/01/22 1611 (!) 147/88     Systolic BP Percentile --      Diastolic BP Percentile --      Pulse Rate 12/01/22 1611 95     Resp 12/01/22 1611 18     Temp 12/01/22 1611 98.5 F (36.9 C)     Temp Source 12/01/22 1611 Oral     SpO2 12/01/22 1611 100 %     Weight 12/01/22 1612 300 lb (136.1 kg)     Height 12/01/22 1612 5\' 10"  (1.778 m)     Head Circumference --      Peak Flow --      Pain Score 12/01/22 1612 0     Pain Loc --      Pain Education --      Exclude from Growth Chart --     Most recent vital signs: Vitals:   12/01/22 1611  BP: (!) 147/88  Pulse: 95  Resp: 18  Temp: 98.5 F (36.9 C)  SpO2: 100%    General: Awake, no distress.  CV:  Good peripheral perfusion.  Resp:  Normal effort.  Abd:  No distention.  Other:  Approximately 3 cm laceration to the dorsal side of the left hand between the index finger and thumb, wound bed is scabbed with no purulent discharge, surrounding tissue is erythematous and swollen.  Tender to palpation.  Range of motion of hand maintained and radial pulses 2+ and regular.   ED Results / Procedures / Treatments   Labs (all labs ordered are listed, but only abnormal results are displayed) Labs Reviewed  BASIC METABOLIC PANEL - Abnormal; Notable for the following components:       Result Value   Sodium 134 (*)    Glucose, Bld 345 (*)    Calcium 8.6 (*)    All other components within normal limits  CBC WITH DIFFERENTIAL/PLATELET    PROCEDURES:  Critical Care performed: No  Procedures   MEDICATIONS ORDERED IN ED: Medications - No data to display   IMPRESSION / MDM / ASSESSMENT AND PLAN / ED COURSE  I reviewed the triage vital signs and the nursing notes.                             32 year old male presents for a wound check.  Patient was hypertensive in triage otherwise vital signs are stable.  Patient does have history of hypertension and reported that he has not taken his blood pressure medication yet today.  Patient NAD on exam.  Differential diagnosis includes, but is not limited to, cellulitis, abscess, wound infection.  Patient's presentation is most consistent with acute complicated illness /  injury requiring diagnostic workup.  CBC and BMP unremarkable aside from elevated blood sugar.  Patient is diabetic and has not given himself any insulin today.  He also states he ate a candy bar on the way to the hospital.  I reiterated the importance of checking his blood sugar regularly and taking his medication as prescribed.  I reviewed wound care with the patient.  Show I told him to stop using alcohol and hydrogen peroxide on the wound.  He was advised to wash the wound daily with soap and water, then apply triple antibiotic ointment and cover with a bandage.  I believe that the erythema is localized irritation due to the alcohol use however given his history of diabetes I will cover with a short course of oral antibiotics.  Patient was agreeable to plan, voiced understanding and was stable at discharge.      FINAL CLINICAL IMPRESSION(S) / ED DIAGNOSES   Final diagnoses:  Visit for wound check     Rx / DC Orders   ED Discharge Orders          Ordered    cephALEXin (KEFLEX) 500 MG capsule  4 times daily        12/01/22 1823              Note:  This document was prepared using Dragon voice recognition software and may include unintentional dictation errors.   Cameron Ali, PA-C 12/01/22 Loann Quill, MD 12/01/22 2228

## 2022-12-16 ENCOUNTER — Ambulatory Visit: Payer: Medicaid Other | Admitting: Family Medicine

## 2023-03-16 ENCOUNTER — Ambulatory Visit: Payer: Medicaid Other | Admitting: Family Medicine

## 2023-03-16 ENCOUNTER — Encounter: Payer: Self-pay | Admitting: Family Medicine

## 2023-03-16 VITALS — BP 146/98 | HR 69 | Resp 20 | Ht 70.0 in | Wt 301.0 lb

## 2023-03-16 DIAGNOSIS — Z79899 Other long term (current) drug therapy: Secondary | ICD-10-CM

## 2023-03-16 DIAGNOSIS — E1140 Type 2 diabetes mellitus with diabetic neuropathy, unspecified: Secondary | ICD-10-CM

## 2023-03-16 DIAGNOSIS — I152 Hypertension secondary to endocrine disorders: Secondary | ICD-10-CM

## 2023-03-16 DIAGNOSIS — E1159 Type 2 diabetes mellitus with other circulatory complications: Secondary | ICD-10-CM

## 2023-03-16 DIAGNOSIS — Z794 Long term (current) use of insulin: Secondary | ICD-10-CM | POA: Diagnosis not present

## 2023-03-16 NOTE — Progress Notes (Signed)
 Established patient visit   Patient: Jeremy Salas   DOB: 07-31-1990   33 y.o. Male  MRN: [id number] Visit Date: 03/16/2023  Today's healthcare provider: Sherlyn Hay, DO   Chief Complaint  Patient presents with   Diabetes   Hypertension   Subjective    Diabetes Pertinent negatives for hypoglycemia include no headaches. Pertinent negatives for diabetes include no chest pain, no polydipsia, no polyphagia, no polyuria and no weakness.  Hypertension Pertinent negatives include no chest pain, headaches, palpitations or shortness of breath.    The patient, with Type 2 Diabetes Mellitus, presents for a regular follow-up visit for diabetes management.  He did not take his medication this morning as he was rushing to the appointment. He typically checks his blood sugar only during these visits every three months and does not monitor it at home. He is currently on a regimen of losartan-hydrochlorothiazide, metformin, and 30 units of NPH insulin twice daily.   He experiences nervousness and increased heart rate when visiting the doctor, which he attributes to stress. He does not regularly check his blood pressure at home but does so occasionally at places like [employer], where it usually reads between 120-130/80-90 mmHg. He is also awaiting the results of a genetic test, which he believes contributed to his blood pressure today.  No symptoms of low blood sugar, nausea, vomiting, or abdominal pain. He has had a cough for the past few days, which he attributes to allergies. He has a history of tingling in his feet when first diagnosed with diabetes, which improved with medication adherence and dietary changes. If he misses a dose of his medication, he can feel a difference in his feet, prompting him to take his medication promptly. No current numbness or tingling, increased thirst, urination, or appetite, and no chest pain, shortness of breath, or palpitations.  In terms of lifestyle, he is  working on diet and exercise, walking on a treadmill multiple times a week and consuming baked foods and water. He exercises four days a week, walking two to three miles each day. He is mindful of his diet, eating vegetables like green beans, although he acknowledges the high salt content in canned vegetables.     Medications: Outpatient Medications Prior to Visit  Medication Sig   blood glucose meter kit and supplies Dispense based on patient and insurance preference. Use Once daily as directed. (FOR ICD-10 E11.9).   insulin NPH-regular Human (NOVOLIN 70/30) (70-30) 100 UNIT/ML injection Inject 35 Units into the skin 2 (two) times daily with a meal. (Patient taking differently: Inject 30 Units into the skin 2 (two) times daily with a meal.)   INSULIN SYRINGE 1CC/29G 29G X 1/2" 1 ML MISC 1 each by Does not apply route in the morning and at bedtime.   losartan-hydrochlorothiazide (HYZAAR) 100-25 MG tablet Take 1 tablet by mouth daily.   metFORMIN (GLUCOPHAGE-XR) 500 MG 24 hr tablet Take 2 tablets (1,000 mg total) by mouth 2 (two) times daily with a meal.   methocarbamol (ROBAXIN) 750 MG tablet Take 1 tablet (750 mg total) by mouth every 8 (eight) hours as needed for muscle spasms.   omeprazole (PRILOSEC OTC) 20 MG tablet Take 20 mg by mouth daily before breakfast.   No facility-administered medications prior to visit.    Review of Systems  Respiratory: Negative.  Negative for cough, shortness of breath and wheezing.   Cardiovascular:  Negative for chest pain, palpitations and leg swelling.  Endocrine: Negative for  polydipsia, polyphagia and polyuria.  Neurological:  Negative for weakness, numbness and headaches.        Objective    BP (!) 146/98 (BP Location: Right Arm, Patient Position: Sitting, Cuff Size: Large)   Pulse 69   Resp 20   Ht 5\' 10"  (1.778 m)   Wt (!) 301 lb (136.5 kg)   SpO2 97%   BMI 43.19 kg/m     Physical Exam Vitals and nursing note reviewed.   Constitutional:      General: He is not in acute distress.    Appearance: Normal appearance.  HENT:     Head: Normocephalic and atraumatic.  Eyes:     General: No scleral icterus.    Extraocular Movements: Extraocular movements intact.     Conjunctiva/sclera: Conjunctivae normal.  Cardiovascular:     Rate and Rhythm: Normal rate and regular rhythm.     Pulses: Normal pulses.          Dorsalis pedis pulses are 2+ on the right side and 2+ on the left side.       Posterior tibial pulses are 2+ on the right side and 2+ on the left side.     Heart sounds: Normal heart sounds.  Pulmonary:     Effort: Pulmonary effort is normal. No respiratory distress.     Breath sounds: Normal breath sounds.  Abdominal:     General: Bowel sounds are normal.     Palpations: Abdomen is soft.  Musculoskeletal:     Right lower leg: No edema.     Left lower leg: No edema.     Right foot: Normal range of motion. No deformity, bunion, Charcot foot, foot drop or prominent metatarsal heads.     Left foot: Normal range of motion. No deformity, bunion, Charcot foot, foot drop or prominent metatarsal heads.  Feet:     Right foot:     Protective Sensation: 10 sites tested.  10 sites sensed.     Skin integrity: No ulcer, blister, skin breakdown, erythema, warmth, callus, dry skin or fissure.     Toenail Condition: Right toenails are normal.     Left foot:     Protective Sensation: 10 sites tested.  10 sites sensed.     Skin integrity: No ulcer, blister, skin breakdown, erythema, warmth, callus, dry skin or fissure.     Toenail Condition: Left toenails are normal.  Skin:    General: Skin is warm and dry.  Neurological:     Mental Status: He is alert and oriented to person, place, and time. Mental status is at baseline.  Psychiatric:        Mood and Affect: Mood normal.        Behavior: Behavior normal.      No results found for any visits on 03/16/23.  Assessment & Plan    Type 2 diabetes mellitus with  diabetic neuropathy, with long-term current use of insulin (HCC) Assessment & Plan: Type 2 diabetes mellitus managed with metformin and insulin (NPH, 30 units twice daily). He does not regularly monitor blood glucose at home. No recent hypoglycemia, nausea, vomiting, or abdominal pain. Engages in diet and exercise, walking 2-3 miles four days a week, consuming baked foods and water. Aware of the importance of medication adherence and dietary management. Informed that maintaining a low A1c could potentially reduce insulin use. - Order hemoglobin A1c to assess average blood glucose levels - Perform foot exam to assess for diabetic neuropathy - Encourage regular blood glucose  monitoring at home - Continue metformin and insulin - Fasting blood work today - Writer modifications and regular exercise  Orders: -     Microalbumin / creatinine urine ratio -     Comprehensive metabolic panel -     Hemoglobin A1c -     Lipid panel -     Vitamin B12  Hypertension associated with diabetes (HCC) Assessment & Plan: Hypertension managed with losartan-hydrochlorothiazide. Infrequent patient blood pressure monitoring through occasional checks at public places. Blood pressure typically 120-130/80-90 mmHg. Anxiety during visits and personal stress may elevate readings. - Encourage regular blood pressure monitoring at home or in public places - Continue current antihypertensive regimen - Reassess blood pressure control in three months - Defer change in medication due to current stress, patient not taking his medication this morning, historically normal values and patient preference. Consider adding additional antihypertensive on return visit if blood pressure still elevated.   Morbid obesity (HCC) Assessment & Plan: Counseled on diet/exercise   High risk medication use -     Vitamin B12  General Health Maintenance Eligible for pneumonia vaccine due to diabetes but declined, citing concerns  about post-vaccination symptoms. Explained vaccines may cause mild symptoms due to immune response, not the illness itself. Discussed weighing risks and benefits. - Discuss potential benefits of pneumonia vaccination in future visits   Return in about 3 months (around 06/16/2023) for DM.      I discussed the assessment and treatment plan with the patient  The patient was provided an opportunity to ask questions and all were answered. The patient agreed with the plan and demonstrated an understanding of the instructions.   The patient was advised to call back or seek an in-person evaluation if the symptoms worsen or if the condition fails to improve as anticipated.    Sherlyn Hay, DO  [facility] [phone] (phone) [phone] (fax)  [facility]

## 2023-03-16 NOTE — Assessment & Plan Note (Signed)
 Counseled on diet/exercise

## 2023-03-16 NOTE — Assessment & Plan Note (Addendum)
 Type 2 diabetes mellitus managed with metformin and insulin (NPH, 30 units twice daily). He does not regularly monitor blood glucose at home. No recent hypoglycemia, nausea, vomiting, or abdominal pain. Engages in diet and exercise, walking 2-3 miles four days a week, consuming baked foods and water. Aware of the importance of medication adherence and dietary management. Informed that maintaining a low A1c could potentially reduce insulin use. - Order hemoglobin A1c to assess average blood glucose levels - Perform foot exam to assess for diabetic neuropathy - Encourage regular blood glucose monitoring at home - Continue metformin and insulin - Fasting blood work today - Writer modifications and regular exercise

## 2023-03-16 NOTE — Assessment & Plan Note (Signed)
 Hypertension managed with losartan-hydrochlorothiazide. Infrequent patient blood pressure monitoring through occasional checks at public places. Blood pressure typically 120-130/80-90 mmHg. Anxiety during visits and personal stress may elevate readings. - Encourage regular blood pressure monitoring at home or in public places - Continue current antihypertensive regimen - Reassess blood pressure control in three months - Defer change in medication due to current stress, patient not taking his medication this morning, historically normal values and patient preference. Consider adding additional antihypertensive on return visit if blood pressure still elevated.

## 2023-03-17 LAB — COMPREHENSIVE METABOLIC PANEL
ALT: 27 IU/L (ref 0–44)
AST: 16 IU/L (ref 0–40)
Albumin: 4.1 g/dL (ref 4.1–5.1)
Alkaline Phosphatase: 96 IU/L (ref 44–121)
BUN/Creatinine Ratio: 24 — ABNORMAL HIGH (ref 9–20)
BUN: 15 mg/dL (ref 6–20)
Bilirubin Total: 0.2 mg/dL (ref 0.0–1.2)
CO2: 25 mmol/L (ref 20–29)
Calcium: 9.3 mg/dL (ref 8.7–10.2)
Chloride: 103 mmol/L (ref 96–106)
Creatinine, Ser: 0.62 mg/dL — ABNORMAL LOW (ref 0.76–1.27)
Globulin, Total: 2.2 g/dL (ref 1.5–4.5)
Glucose: 317 mg/dL — ABNORMAL HIGH (ref 70–99)
Potassium: 4.2 mmol/L (ref 3.5–5.2)
Sodium: 142 mmol/L (ref 134–144)
Total Protein: 6.3 g/dL (ref 6.0–8.5)
eGFR: 129 mL/min/{1.73_m2} (ref >59)

## 2023-03-17 LAB — VITAMIN B12: Vitamin B-12: 442 pg/mL (ref 232–1245)

## 2023-03-17 LAB — LIPID PANEL
Chol/HDL Ratio: 4.9 ratio (ref 0.0–5.0)
Cholesterol, Total: 156 mg/dL (ref 100–199)
HDL: 32 mg/dL — ABNORMAL LOW (ref >39)
LDL Chol Calc (NIH): 101 mg/dL — ABNORMAL HIGH (ref 0–99)
Triglycerides: 128 mg/dL (ref 0–149)
VLDL Cholesterol Cal: 23 mg/dL (ref 5–40)

## 2023-03-17 LAB — MICROALBUMIN / CREATININE URINE RATIO
Creatinine, Urine: 93.7 mg/dL
Microalb/Creat Ratio: 4 mg/g{creat} (ref 0–29)
Microalbumin, Urine: 3.5 ug/mL

## 2023-03-17 LAB — HEMOGLOBIN A1C
Est. average glucose Bld gHb Est-mCnc: 280 mg/dL
Hgb A1c MFr Bld: 11.4 % — ABNORMAL HIGH (ref 4.8–5.6)

## 2023-03-22 ENCOUNTER — Other Ambulatory Visit: Payer: Self-pay

## 2023-03-22 ENCOUNTER — Encounter: Payer: Self-pay | Admitting: Family Medicine

## 2023-03-22 DIAGNOSIS — E1140 Type 2 diabetes mellitus with diabetic neuropathy, unspecified: Secondary | ICD-10-CM

## 2023-03-22 DIAGNOSIS — Z6841 Body Mass Index (BMI) 40.0 and over, adult: Secondary | ICD-10-CM

## 2023-04-01 ENCOUNTER — Telehealth: Payer: Self-pay | Admitting: Family Medicine

## 2023-04-01 DIAGNOSIS — E1159 Type 2 diabetes mellitus with other circulatory complications: Secondary | ICD-10-CM

## 2023-04-01 MED ORDER — LOSARTAN POTASSIUM-HCTZ 100-25 MG PO TABS: 1.0000 | ORAL_TABLET | Freq: Every day | ORAL | 3 refills | Status: AC

## 2023-04-01 NOTE — Telephone Encounter (Signed)
[facility] faxed refill request for the following medications:   losartan-hydrochlorothiazide (HYZAAR) 100-25 MG tablet    Please advise.

## 2023-04-01 NOTE — Telephone Encounter (Signed)
Rx has been approved

## 2023-06-09 ENCOUNTER — Telehealth: Payer: Self-pay | Admitting: Family Medicine

## 2023-06-09 DIAGNOSIS — E1165 Type 2 diabetes mellitus with hyperglycemia: Secondary | ICD-10-CM

## 2023-06-09 MED ORDER — METFORMIN HCL ER 500 MG PO TB24: 1000.0000 mg | ORAL_TABLET | Freq: Two times a day (BID) | ORAL | 1 refills | Status: DC

## 2023-06-09 NOTE — Telephone Encounter (Signed)
 [facility] is requesting refill metFORMIN  (GLUCOPHAGE -XR) 500 MG 24 hr tablet   Please advise

## 2023-06-16 ENCOUNTER — Ambulatory Visit: Admitting: Family Medicine

## 2023-08-02 ENCOUNTER — Encounter: Payer: Self-pay | Admitting: Family Medicine

## 2023-08-02 ENCOUNTER — Ambulatory Visit: Admitting: Family Medicine

## 2023-08-02 VITALS — BP 127/76 | HR 102 | Temp 98.4°F | Ht 70.0 in | Wt 305.0 lb

## 2023-08-02 DIAGNOSIS — Z7984 Long term (current) use of oral hypoglycemic drugs: Secondary | ICD-10-CM

## 2023-08-02 DIAGNOSIS — G5791 Unspecified mononeuropathy of right lower limb: Secondary | ICD-10-CM

## 2023-08-02 DIAGNOSIS — Z794 Long term (current) use of insulin: Secondary | ICD-10-CM | POA: Diagnosis not present

## 2023-08-02 DIAGNOSIS — E119 Type 2 diabetes mellitus without complications: Secondary | ICD-10-CM | POA: Diagnosis not present

## 2023-08-02 MED ORDER — LANTUS SOLOSTAR 100 UNIT/ML ~~LOC~~ SOPN: 48.0000 [IU] | PEN_INJECTOR | Freq: Every day | SUBCUTANEOUS | 1 refills | Status: DC

## 2023-08-02 MED ORDER — LANCETS MISC. MISC: 1.0000 | Freq: Every day | 3 refills | Status: AC

## 2023-08-02 MED ORDER — SEMAGLUTIDE(0.25 OR 0.5MG/DOS) 2 MG/3ML ~~LOC~~ SOPN: 0.2500 mg | PEN_INJECTOR | SUBCUTANEOUS | 1 refills | Status: DC

## 2023-08-02 MED ORDER — LANCET DEVICE MISC: 1.0000 | Freq: Every day | 0 refills | Status: AC

## 2023-08-02 MED ORDER — BLOOD GLUCOSE TEST VI STRP: 1.0000 | ORAL_STRIP | Freq: Every day | 3 refills | Status: AC

## 2023-08-02 MED ORDER — INSULIN PEN NEEDLE 31G X 6 MM MISC: 1.0000 | Freq: Two times a day (BID) | 99 refills | Status: AC

## 2023-08-02 MED ORDER — BLOOD GLUCOSE MONITORING SUPPL DEVI: 1.0000 | Freq: Every day | 0 refills | Status: AC

## 2023-08-02 MED ORDER — GABAPENTIN 100 MG PO CAPS: 100.0000 mg | ORAL_CAPSULE | Freq: Every evening | ORAL | 1 refills | Status: AC | PRN

## 2023-08-02 NOTE — Progress Notes (Signed)
 Established patient visit   Patient: Jeremy Salas   DOB: [birth year]   33 y.o. Male  MRN: [id number] Visit Date: 08/02/2023  Today's healthcare provider: LAURAINE LOISE BUOY, DO   Chief Complaint  Patient presents with   Diabetes    Patient needs new meter.  He states his broke and he has been unable to check his readings.    Leg Pain    Patient also complains of right leg pain,thigh area.  He has been using Methocarbamol  with no relief.  He states he injured his leg in an accident.  What he is describing sounds more like nerve pain than muscle pain or cramps.    Subjective    Diabetes  Leg Pain    Jeremy Salas is a 33 year old male with diabetes who presents for follow-up on blood sugar management and leg pain.  He is managing his diabetes with metformin , typically taken in the morning with breakfast, though he occasionally forgets the afternoon dose. He also uses insulin  70/30, 30 units twice daily, but notes his syringe only measures up to 30 units. He purchases insulin  over the counter at [facility], using nearly two vials monthly. He takes omeprazole 20 mg daily for acid reflux (and experiences reflux if he misses a dose) and losartan -hydrochlorothiazide for hypertension. He struggles with dietary adherence, particularly with bread and endorses occasional cheat days eating a honey bun. He had an eye exam last summer.  He experiences leg pain following a right femur fracture on November 30, 2018, treated with a titanium rod placement. The pain is described as a stabbing sensation that sometimes radiates down his leg. Methocarbamol  was previously tried but was ineffective and caused drowsiness, leading to discontinuation. Financial constraints have limited further imaging like x-rays.  He has a history of tingling sensations in his feet, described as 'walking on needles,' which has improved after he was started on antihyperglycemic's. No current issues with tingling or burning  sensations in his feet.  Family history includes thyroid problems in his mother, aunt, and grandmother, and breast and lung cancer in his mother.  He has a history of incarceration, during which he received vaccinations, including HPV.  He notes he received the hepatitis B vaccine series when he was in school      Medications: Outpatient Medications Prior to Visit  Medication Sig   insulin  NPH-regular Human (NOVOLIN  70/30) (70-30) 100 UNIT/ML injection Inject 35 Units into the skin 2 (two) times daily with a meal.   INSULIN  SYRINGE 1CC/29G 29G X 1/2 1 ML MISC 1 each by Does not apply route in the morning and at bedtime.   losartan -hydrochlorothiazide (HYZAAR) 100-25 MG tablet Take 1 tablet by mouth daily.   metFORMIN  (GLUCOPHAGE -XR) 500 MG 24 hr tablet Take 2 tablets (1,000 mg total) by mouth 2 (two) times daily with a meal.   omeprazole (PRILOSEC OTC) 20 MG tablet Take 20 mg by mouth daily before breakfast.   [DISCONTINUED] methocarbamol  (ROBAXIN ) 750 MG tablet Take 1 tablet (750 mg total) by mouth every 8 (eight) hours as needed for muscle spasms.   blood glucose meter kit and supplies Dispense based on patient and insurance preference. Use Once daily as directed. (FOR ICD-10 E11.9).   No facility-administered medications prior to visit.        Objective    BP 127/76 (BP Location: Right Arm, Patient Position: Sitting, Cuff Size: Large)   Pulse (!) 102   Temp 98.4 F (36.9 C) (  Oral)   Ht 5' 10 (1.778 m)   Wt (!) 305 lb (138.3 kg)   SpO2 96%   BMI 43.76 kg/m     Physical Exam Vitals and nursing note reviewed.  Constitutional:      General: He is not in acute distress.    Appearance: Normal appearance.  HENT:     Head: Normocephalic and atraumatic.  Eyes:     General: No scleral icterus.    Conjunctiva/sclera: Conjunctivae normal.  Cardiovascular:     Rate and Rhythm: Normal rate.  Pulmonary:     Effort: Pulmonary effort is normal.  Neurological:     Mental  Status: He is alert and oriented to person, place, and time. Mental status is at baseline.  Psychiatric:        Mood and Affect: Mood normal.        Behavior: Behavior normal.      No results found for any visits on 08/02/23.  Assessment & Plan    Neuropathy of right thigh -     Gabapentin ; Take 1-2 capsules (100-200 mg total) by mouth at bedtime as needed.  Dispense: 90 capsule; Refill: 1  Diabetes mellitus without complication (HCC) -     Hemoglobin A1c -     Blood Glucose Monitoring Suppl; 1 each by Does not apply route daily before breakfast. May substitute to any manufacturer covered by patient's insurance.  Dispense: 1 each; Refill: 0 -     Blood Glucose Test; 1 each by In Vitro route daily before breakfast. May substitute to any manufacturer covered by patient's insurance.  Dispense: 100 strip; Refill: 3 -     Lancet Device; 1 each by Does not apply route daily before breakfast. May substitute to any manufacturer covered by patient's insurance.  Dispense: 1 each; Refill: 0 -     Lancets Misc.; 1 each by Does not apply route daily before breakfast. May substitute to any manufacturer covered by patient's insurance.  Dispense: 100 each; Refill: 3 -     Lantus  SoloStar; Inject 48 Units into the skin daily. Increase by 2 units every 3 days until blood sugars are less than 140, or until maximum 60 units daily.  Dispense: 30 mL; Refill: 1 -     Insulin  Pen Needle; 1 each by Does not apply route in the morning and at bedtime.  Dispense: 100 each; Refill: PRN -     Semaglutide (0.25 or 0.5MG /DOS); Inject 0.25 mg into the skin once a week.  Dispense: 3 mL; Refill: 1 -     AMB Referral VBCI Care Management    Diabetes Mellitus type II POC Hemoglobin A1c test invalid due to high hemoglobin (machine gave  an error code). Inconsistent metformin  use and difficulty with insulin  adherence. Discussed switching to Lantus  for better compliance and efficacy. High blood glucose and dietary non-compliance  noted. Discussed Ozempic  or Mounjaro for improved control, pending insurance. - Order hemoglobin A1c test. - Prescribe Lantus  insulin , start at 48 units daily, increase by 2 units every 3 days until blood glucose <140 mg/dL or max 60 units daily.  Reassess on follow-up. - Prescribe Ozempic , pending insurance approval. - Send prescription for new glucometer kit - Instruct to maintain blood glucose log and bring to next appointment. - Discuss dietary modifications for glycemic control.  Chronic Pain in Right Femur Chronic pain post-fracture and rod placement. Methocarbamol  ineffective. Neuropathic pain considered, gabapentin  suggested. - Prescribe gabapentin  100 mg at bedtime, increase to 200 mg if needed after a  couple of weeks.  Reassess on follow-up.  Hypertension Blood pressure well-controlled on losartan -hydrochlorothiazide.  Gastroesophageal Reflux Disease (GERD) Effective control with over-the-counter omeprazole 20 mg daily. Prefers OTC purchase.  General Health Maintenance Due for annual eye exam due to diabetes. Received HPV and hepatitis B vaccines, likely during incarceration and school. Tetanus booster not due until 2030. - Recommend annual eye exam. - Request vaccination records for HPV and hepatitis B.    Follow-up Follow up in six weeks to assess new insulin  regimen and gabapentin  effectiveness. - Schedule follow-up appointment in six weeks.  Return in about 6 weeks (around 09/13/2023) for DM.      I discussed the assessment and treatment plan with the patient  The patient was provided an opportunity to ask questions and all were answered. The patient agreed with the plan and demonstrated an understanding of the instructions.   The patient was advised to call back or seek an in-person evaluation if the symptoms worsen or if the condition fails to improve as anticipated.    LAURAINE LOISE BUOY, DO  [facility] [phone] (phone) [phone]  (fax)  [facility]

## 2023-08-03 LAB — HEMOGLOBIN A1C
Est. average glucose Bld gHb Est-mCnc: 249 mg/dL
Hgb A1c MFr Bld: 10.3 % — ABNORMAL HIGH (ref 4.8–5.6)

## 2023-08-04 ENCOUNTER — Telehealth: Payer: Self-pay | Admitting: Pharmacy Technician

## 2023-08-04 ENCOUNTER — Other Ambulatory Visit (HOSPITAL_COMMUNITY): Payer: Self-pay

## 2023-08-04 NOTE — Telephone Encounter (Signed)
 Pharmacy Patient Advocate Encounter  Received notification from [facility] that Prior Authorization for Ozempic  (0.25 or 0.5 MG/DOSE) 2MG /3ML pen-injectors has been APPROVED from 08/04/23 to 08/03/24. Ran test claim, Copay is $4.00. This test claim was processed through [facility]- copay amounts may vary at other pharmacies due to pharmacy/plan contracts, or as the patient moves through the different stages of their insurance plan.   PA #/Case ID/Reference #: [id number]

## 2023-08-04 NOTE — Telephone Encounter (Signed)
 Pharmacy Patient Advocate Encounter   Received notification from [employer] that prior authorization for Ozempic  (0.25 or 0.5 MG/DOSE) 2MG /3ML pen-injectors is required/requested.   Insurance verification completed.   The patient is insured through [facility] .   Per test claim: PA required; PA submitted to above mentioned insurance via CoverMyMeds Key/confirmation #/EOC [id number] Status is pending

## 2023-08-06 ENCOUNTER — Ambulatory Visit: Payer: Self-pay | Admitting: Family Medicine

## 2023-08-16 ENCOUNTER — Telehealth: Payer: Self-pay

## 2023-08-16 NOTE — Progress Notes (Signed)
 Brief Telephone Documentation Reason for Call: Left message to schedule appointment with clinical pharmacist.   Summary of Call: No answer, left voicemail with direct callback number.   Haneef Hallquist E. Marsh, PharmD Clinical Pharmacist [facility] [phone]

## 2023-08-18 NOTE — Telephone Encounter (Signed)
 Brief Telephone Documentation Reason for Call: Medication management  Summary of Call: Called patient to schedule appointment with clinical pharmacist. Attempt #2. No answer, left voicemail with callback number.    Jeremy Salas, PharmD Clinical Pharmacist [facility] [phone]

## 2023-08-25 ENCOUNTER — Telehealth: Payer: Self-pay

## 2023-08-25 NOTE — Progress Notes (Unsigned)
 Complex Care Management Note Care Guide Note  08/25/2023 Name: Jeremy Salas MRN: [id number] DOB: 1990/12/27   Complex Care Management Outreach Attempts: An unsuccessful telephone outreach was attempted today to offer the patient information about available complex care management services.  Follow Up Plan:  Additional outreach attempts will be made to offer the patient complex care management information and services.   Encounter Outcome:  No Answer  Dreama [facility]  [facility], [facility] VBCI Assistant Direct Dial: [phone]  Fax: [phone]

## 2023-08-27 NOTE — Progress Notes (Signed)
 Complex Care Management Care Guide Note  08/27/2023 Name: MERLE CIRELLI MRN: [id number] DOB: 09/16/1990  THEOTIS GERDEMAN is a 33 y.o. year old male who is a primary care patient of Pardue, Lauraine SAILOR, DO and is actively engaged with the care management team. I reached out to Khai R Highland by phone today to assist with re-scheduling  with the Pharmacist.  Follow up plan: Face to Face appointment with complex care management team member scheduled for:  09/29/23 at 10:30 a.m.   Dreama [facility]  [facility], [facility] VBCI Assistant Direct Dial: [phone]  Fax: [phone]

## 2023-09-29 ENCOUNTER — Ambulatory Visit: Admitting: Family Medicine

## 2023-09-29 ENCOUNTER — Ambulatory Visit

## 2023-09-29 ENCOUNTER — Telehealth: Payer: Self-pay

## 2023-09-29 ENCOUNTER — Encounter: Payer: Self-pay | Admitting: Family Medicine

## 2023-09-29 VITALS — BP 133/86 | HR 82 | Ht 70.0 in | Wt 300.3 lb

## 2023-09-29 DIAGNOSIS — E1140 Type 2 diabetes mellitus with diabetic neuropathy, unspecified: Secondary | ICD-10-CM

## 2023-09-29 DIAGNOSIS — Z794 Long term (current) use of insulin: Secondary | ICD-10-CM

## 2023-09-29 NOTE — Progress Notes (Unsigned)
 Care Guide Pharmacy Note  09/29/2023 Name: ALVER LEETE MRN: [id number] DOB: 27-Mar-1990  Referred By: Donzella Lauraine SAILOR, DO Reason for referral: Call Attempt #1 and Complex Care Management (Unsuccessful initial outreach to schedule with PHARM D- Allyson)   LAMIN CHANDLEY is a 33 y.o. year old male who is a primary care patient of Pardue, Lauraine SAILOR, DO.  Fortunato R Macpherson was referred to the pharmacist for assistance related to: DMII  An unsuccessful telephone outreach was attempted today to contact the patient who was referred to the pharmacy team for assistance with medication assistance. Additional attempts will be made to contact the patient.  Leotis Rase [facility] [place] Guide  Direct Dial: [phone]  Fax [phone]

## 2023-09-29 NOTE — Patient Instructions (Addendum)
 Refill Ozempic  and start 0.5 mg dose after completing four weeks of the 0.25 mg dose.   As we increase the ozempic , if your blood sugars start trending under 140 consistently, go ahead and decrease your insulin  dose by 2-3 units per day and continue that dose for 3 days.  If you continue to trend low, you may increase again after that.     If you experience low blood sugar episodes (less than 70), decrease the dose for that as well.

## 2023-09-29 NOTE — Progress Notes (Deleted)
 S:     No chief complaint on file.   Reason for visit: ?  Jeremy Salas is a 33 y.o. male with a history of diabetes ({Blank single:19197::type 1,type 2,unclear type}), who presents today for {lzvisittype:31254} diabetes pharmacotherapy visit.? Pertinent PMH also includes ***.  Known DM Complications: {DM complications:33329}   Care Team: Primary Care Provider: Donzella Lauraine SAILOR, DO  At last visit with PCP on 08/02/23, A1c was improved at 10.3%. Patient reported inconsistent metformin  and insulin  use. He was instructed to discontinue OTC insulin  and start Lantus  48 units daily and Ozempic  0.25 mg weekly.   Patient reports Diabetes was diagnosed in ***.   Current diabetes medications include: Ozempic  0.25 mg weekly, metformin  ER 500 mg 2 tablets BID, Lantus  48 units daily  Previous diabetes medications include: *** Current hypertension medications include: losartan /hydrochlorothiazide 100/25 mg daily Current hyperlipidemia medications include: none  Patient reports adherence to taking all medications as prescribed.  *** Patient denies adherence with medications, reports missing *** medications *** times per week, on average.  Have you been experiencing any side effects to the medications prescribed? {YES NO:22349} Do you have any problems obtaining medications due to transportation or finances? {YES [id number] Insurance coverage: [employer]- Medicaid  Patient {Actions; denies-reports:120008} hypoglycemic events.  Reported home fasting blood sugars: ***  Reported 2 hour post-meal/random blood sugars: ***.  Patient {Actions; denies-reports:120008} nocturia (nighttime urination).  Patient {Actions; denies-reports:120008} neuropathy (nerve pain). Patient {Actions; denies-reports:120008} visual changes. Patient {Actions; denies-reports:120008} self foot exams.   Patient reported dietary habits: Eats *** meals/day Breakfast: *** Lunch: *** Dinner: *** Snacks: *** Drinks:  ***  Patient-reported exercise habits: *** DM Prevention:  History of chronic kidney disease? no History of albuminuria? no, last UACR on 03/16/23 = 4 mg/g Last eye exam: ***; {lzdmeyeexam:31121} Lab Results  Component Value Date   HMDIABEYEEXA Retinopathy (A) 07/08/2022   Tobacco Use:  Tobacco Use: Medium Risk (08/02/2023)   Patient History    Smoking Tobacco Use: Former    Smokeless Tobacco Use: Never    Passive Exposure: Not on file   O:  Vitals:  Wt Readings from Last 3 Encounters:  08/02/23 (!) 305 lb (138.3 kg)  03/16/23 (!) 301 lb (136.5 kg)  12/01/22 300 lb (136.1 kg)   BP Readings from Last 3 Encounters:  08/02/23 127/76  03/16/23 (!) 146/98  12/01/22 (!) 147/88   Pulse Readings from Last 3 Encounters:  08/02/23 (!) 102  03/16/23 69  12/01/22 95     Labs:?  Lab Results  Component Value Date   HGBA1C 10.3 (H) 08/02/2023   HGBA1C 11.4 (H) 03/16/2023   HGBA1C 7.7 (A) 09/24/2022   GLUCOSE 317 (H) 03/16/2023   MICRALBCREAT 4 03/16/2023   MICRALBCREAT 11 10/01/2021   MICRALBCREAT 14 01/24/2021   CREATININE 0.62 (L) 03/16/2023   CREATININE 0.75 12/01/2022   CREATININE 0.82 10/01/2021    Lab Results  Component Value Date   CHOL 156 03/16/2023   LDLCALC 101 (H) 03/16/2023   LDLCALC 87 10/01/2021   HDL 32 (L) 03/16/2023   TRIG 128 03/16/2023   TRIG 142 10/01/2021   ALT 27 03/16/2023   ALT 37 10/01/2021   AST 16 03/16/2023   AST 22 10/01/2021      Chemistry      Component Value Date/Time   NA 142 03/16/2023 1039   K 4.2 03/16/2023 1039   CL 103 03/16/2023 1039   CO2 25 03/16/2023 1039   BUN 15 03/16/2023 1039   CREATININE  0.62 (L) 03/16/2023 1039      Component Value Date/Time   CALCIUM  9.3 03/16/2023 1039   ALKPHOS 96 03/16/2023 1039   AST 16 03/16/2023 1039   ALT 27 03/16/2023 1039   BILITOT 0.2 03/16/2023 1039       The ASCVD Risk score (Arnett DK, et al., 2019) failed to calculate for the following reasons:   The 2019 ASCVD risk  score is only valid for ages 46 to 76  Lab Results  Component Value Date   MICRALBCREAT 4 03/16/2023   MICRALBCREAT 11 10/01/2021   MICRALBCREAT 14 01/24/2021    A/P: Diabetes currently uncontrolled with a most recent A1c of 10.3% on 08/02/23, which is down from 11.4% on 03/16/23. Patient is *** able to verbalize appropriate hypoglycemia management plan. Medication adherence appears ***. Control is suboptimal due to ***. -{Meds adjust:18428} basal insulin  {basal insulins:33573}  *** units daily.  -{Meds adjust:18428} rapid insulin  {bolus insulin :33574} ***.  -{Meds adjust:18428} GLP-1 {GLP1 options:33572} *** mg .  -{Meds adjust:18428} SGLT2-I {SGLT2i options:33571}*** mg daily.  -{Meds adjust:18428} metformin  ***.  -Patient educated on purpose, proper use, and potential adverse effects of ***.  -Extensively discussed pathophysiology of diabetes, recommended lifestyle interventions, dietary effects on blood sugar control.  -Counseled on s/sx of and management of hypoglycemia.  -Next A1c anticipated 10/2023.   ASCVD risk - {primary/secondary:33575} prevention in patient with diabetes. Last LDL is 101 mg/dL, not at goal of <29 *** mg/dL. ASCVD risk factors include *** and 10-year ASCVD risk score of ***. {Desc; low/moderate/high:110033} intensity statin indicated.  -{Meds adjust:18428} {statin therapies:33576} *** mg daily.   Hypertension longstanding *** currently ***. Blood pressure goal of <130/80 *** mmHg. Medication adherence ***. Blood pressure control is suboptimal due to ***. -{Meds adjust:18428} *** mg ***.  {pharmacisttime:33368}  Follow-up:  Pharmacist on *** PCP clinic visit on ***  Peyton CHARLENA Ferries, PharmD Clinical Pharmacist [facility] [phone]

## 2023-09-29 NOTE — Progress Notes (Signed)
 Established patient visit   Patient: Jeremy Salas   DOB: [birth year]   33 y.o. Male  MRN: [id number] Visit Date: 09/29/2023  Today's healthcare provider: LAURAINE LOISE BUOY, DO   Chief Complaint  Patient presents with   Medical Management of Chronic Issues    Follow-up. Diabetic Eye Exam: Not up to date. Patient declined flu vaccine.   Subjective    HPI Jeremy Salas is a 33 year old male with type 2 diabetes who presents for follow-up on diabetes management.  He started Ozempic  last week and has taken two doses of 0.25 mg once a week without experiencing adverse effects such as nausea, vomiting, or constipation. He is also on insulin  glargine, administering 25 units twice a day, and has been monitoring his blood sugar levels, which he reports used to be in the 300-500 mg/dL range and are now under 200 mg/dL (predominantly 839d-829d), with some readings in the 150s. He attributes this improvement to dietary changes, including avoiding mashed potatoes and rice, and primarily consuming green vegetables and baked meats, along with drinking only water.  He experiences occasional episodes of feeling 'woozy,' particularly if he has not eaten all day, but does not believe he has had significant hypoglycemic episodes. He is currently taking metformin , four tablets a day.  He has not yet scheduled an eye exam, recalling a previous experience with eye dilation that was uncomfortable and affected his vision for the day.       Medications: Outpatient Medications Prior to Visit  Medication Sig   blood glucose meter kit and supplies Dispense based on patient and insurance preference. Use Once daily as directed. (FOR ICD-10 E11.9).   Blood Glucose Monitoring Suppl DEVI 1 each by Does not apply route daily before breakfast. May substitute to any manufacturer covered by patient's insurance.   gabapentin  (NEURONTIN ) 100 MG capsule Take 1-2 capsules (100-200 mg total) by mouth at bedtime as  needed.   Glucose Blood (BLOOD GLUCOSE TEST STRIPS) STRP 1 each by In Vitro route daily before breakfast. May substitute to any manufacturer covered by patient's insurance.   insulin  glargine (LANTUS  SOLOSTAR) 100 UNIT/ML Solostar Pen Inject 48 Units into the skin daily. Increase by 2 units every 3 days until blood sugars are less than 140, or until maximum 60 units daily.   Insulin  Pen Needle 31G X 6 MM MISC 1 each by Does not apply route in the morning and at bedtime.   INSULIN  SYRINGE 1CC/29G 29G X 1/2 1 ML MISC 1 each by Does not apply route in the morning and at bedtime.   Lancet Device MISC 1 each by Does not apply route daily before breakfast. May substitute to any manufacturer covered by patient's insurance.   losartan -hydrochlorothiazide (HYZAAR) 100-25 MG tablet Take 1 tablet by mouth daily.   metFORMIN  (GLUCOPHAGE -XR) 500 MG 24 hr tablet Take 2 tablets (1,000 mg total) by mouth 2 (two) times daily with a meal.   omeprazole (PRILOSEC OTC) 20 MG tablet Take 20 mg by mouth daily before breakfast.   Semaglutide ,0.25 or 0.5MG /DOS, 2 MG/3ML SOPN Inject 0.25 mg into the skin once a week.   [DISCONTINUED] insulin  NPH-regular Human (NOVOLIN  70/30) (70-30) 100 UNIT/ML injection Inject 35 Units into the skin 2 (two) times daily with a meal.   No facility-administered medications prior to visit.        Objective    BP 133/86 (BP Location: Left Arm, Patient Position: Sitting, Cuff Size: Large)   Pulse  82   Ht 5' 10 (1.778 m)   Wt (!) 300 lb 4.8 oz (136.2 kg)   SpO2 97%   BMI 43.09 kg/m     Physical Exam Vitals and nursing note reviewed.  Constitutional:      General: He is not in acute distress.    Appearance: Normal appearance.  HENT:     Head: Normocephalic and atraumatic.  Eyes:     General: No scleral icterus.    Conjunctiva/sclera: Conjunctivae normal.  Cardiovascular:     Rate and Rhythm: Normal rate.  Pulmonary:     Effort: Pulmonary effort is normal.  Neurological:      Mental Status: He is alert and oriented to person, place, and time. Mental status is at baseline.  Psychiatric:        Mood and Affect: Mood normal.        Behavior: Behavior normal.      No results found for any visits on 09/29/23.  Assessment & Plan    Type 2 diabetes mellitus with diabetic neuropathy, with long-term current use of insulin  (HCC)   Type 2 diabetes mellitus with diabetic neuropathy, with long-term current use of insulin  Improved glycemic control with current regimen. Blood glucose levels reduced to 150-200 mg/dL. No significant side effects from Ozempic . Dietary modifications implemented. - Continue Ozempic , increase to 0.5 mg after four weeks. - Monitor blood glucose levels regularly. - Decrease nightly insulin  glargine dose by 2-3 units if blood glucose levels consistently fall below 150 mg/dL. - If experiencing hypoglycemia, decrease nightly insulin  glargine dose by 5 units immediately and consume glucose tablets or food. - Continue metformin  4 tablets daily. - Schedule a diabetic eye exam.    Return in about 7 weeks (around 11/17/2023) for DM.      I discussed the assessment and treatment plan with the patient  The patient was provided an opportunity to ask questions and all were answered. The patient agreed with the plan and demonstrated an understanding of the instructions.   The patient was advised to call back or seek an in-person evaluation if the symptoms worsen or if the condition fails to improve as anticipated.    LAURAINE LOISE BUOY, DO  [facility] [phone] (phone) [phone] (fax)  [facility]

## 2023-09-30 NOTE — Progress Notes (Unsigned)
 Complex Care Management Note Care Guide Note  09/30/2023 Name: Jeremy Salas MRN: [id number] DOB: July 06, 1990   Complex Care Management Outreach Attempts: A second unsuccessful outreach was attempted today to offer the patient with information about available complex care management services.  Follow Up Plan:  Additional outreach attempts will be made to offer the patient complex care management information and services.   Encounter Outcome:  No Answer-Left voicemail  [facility] Guide  Direct Dial: [phone]  Fax [phone]

## 2023-10-01 NOTE — Progress Notes (Signed)
 Care Guide Pharmacy Note  10/01/2023 Name: [name] MRN: [id number] DOB: August 15, 1990  Referred By: Donzella Lauraine SAILOR, DO Reason for referral: Call Attempt #1, Complex Care Management (Unsuccessful initial outreach to schedule with PHARM DGLENWOOD Keeling), Call Attempt #2 (Unsuccessful initial outreach to schedule with PHARM D), and Call Attempt #3 (Unsuccessful initial outreach to schedule with PHARM D-Allyson)   Jeremy Salas is a 33 y.o. year old male who is a primary care patient of Pardue, Lauraine SAILOR, DO.  Jeremy Salas was referred to the pharmacist for assistance related to: DMII  A third unsuccessful telephone outreach was attempted today to contact the patient who was referred to the pharmacy team for assistance with medication assistance. The Population Health team is pleased to engage with this patient at any time in the future upon receipt of referral and should he/she be interested in assistance from the [facility] team.  Jeremy Salas [facility]  [facility], [facility] Guide  Direct Dial: [phone]  Fax [phone]

## 2023-11-24 ENCOUNTER — Ambulatory Visit: Admitting: Family Medicine

## 2023-11-24 ENCOUNTER — Encounter: Payer: Self-pay | Admitting: Family Medicine

## 2023-11-24 VITALS — BP 113/80 | HR 88 | Temp 98.3°F | Ht 70.0 in | Wt 293.8 lb

## 2023-11-24 DIAGNOSIS — E1159 Type 2 diabetes mellitus with other circulatory complications: Secondary | ICD-10-CM | POA: Diagnosis not present

## 2023-11-24 DIAGNOSIS — Z794 Long term (current) use of insulin: Secondary | ICD-10-CM

## 2023-11-24 DIAGNOSIS — E1140 Type 2 diabetes mellitus with diabetic neuropathy, unspecified: Secondary | ICD-10-CM | POA: Diagnosis not present

## 2023-11-24 DIAGNOSIS — E1169 Type 2 diabetes mellitus with other specified complication: Secondary | ICD-10-CM | POA: Insufficient documentation

## 2023-11-24 DIAGNOSIS — E785 Hyperlipidemia, unspecified: Secondary | ICD-10-CM

## 2023-11-24 DIAGNOSIS — I152 Hypertension secondary to endocrine disorders: Secondary | ICD-10-CM | POA: Diagnosis not present

## 2023-11-24 MED ORDER — SEMAGLUTIDE(0.25 OR 0.5MG/DOS) 2 MG/3ML ~~LOC~~ SOPN: 0.5000 mg | PEN_INJECTOR | SUBCUTANEOUS | 2 refills | Status: AC

## 2023-11-24 NOTE — Assessment & Plan Note (Addendum)
 Chronic, stable on losartan -hydrochlorothiazide 100-25 mg daily. Mildly elevated today. Blood pressure not regularly monitored at home. Consistent medication adherence. Weight loss expected to aid control. - Rechecked blood pressure during visit with noted improvement. - Continue current antihypertensive regimen.

## 2023-11-24 NOTE — Assessment & Plan Note (Signed)
 Managed with semaglutide , metformin , and Lantus  (prescribed as 48 units daily; taken as 24 units twice daily). Reports gastrointestinal side effects (burping). Blood sugar well-controlled on irregular checks. A1c expected to improve. Discussed potential switch to Mounjaro if side effects persist. - Continue semaglutide  0.5 mg weekly. - Reduce metformin  to two tablets in the morning and one at night. - Reduce Lantus  by 2 to 3 units if blood sugar noted to be <70 mg/dL. - Order A1c, metabolic panel, and cholesterol tests. - Encouraged regular eye exams.

## 2023-11-24 NOTE — Progress Notes (Signed)
 Established patient visit   Patient: Jeremy Salas   DOB: [birth year]   33 y.o. Male  MRN: [id number] Visit Date: 11/24/2023  Today's healthcare provider: LAURAINE LOISE BUOY, DO   Chief Complaint  Patient presents with   Follow-up    Patient is here today for a 8 week follow up on diabetes, patient is taking Ozempic  states that he was put on at last visit.  Reports that he does not like it and that sometimes when he burps it stinks and nasty tasting.  States that it tends to make him have a sour stomach.  Diabetic Eye Exam- 2024 [facility]   Subjective    HPI [name] is a 33 year old male with diabetes who presents with gastrointestinal side effects from medication.  He experiences significant gastrointestinal side effects, specifically large, odorous burps, after taking his diabetes medication. These symptoms typically begin the night after taking the medication and persist for a day or two, occurring about half the week. He finds these symptoms embarrassing and disruptive to his social activities.  He is currently on a dose of 0.5 mg of semaglutide  (Ozempic ) weekly and has been experiencing these side effects consistently for the first couple of days after taking the medication. He describes feeling 'really, really weird,' akin to flu-like symptoms, including lightheadedness and mild weakness, during this period. These symptoms have not changed over the months he has been on the medication.  He checks his blood sugar occasionally and noted a reading of 137 mg/dL, which he considers low for him. He follows a low-carb diet, primarily consuming grilled meats and vegetables, and walks two miles daily, which he believes should help lower his A1c.  He is also on losartan -hydrochlorothiazide 100-25 mg daily for blood pressure management, which he takes consistently, although he does not monitor his blood pressure at home.  He takes 24 units of Lantus  twice a day and  metformin , which he has been taking as two tablets in the morning and one at night. He has not experienced any issues with his other medications.      Medications: Outpatient Medications Prior to Visit  Medication Sig   blood glucose meter kit and supplies Dispense based on patient and insurance preference. Use Once daily as directed. (FOR ICD-10 E11.9).   Blood Glucose Monitoring Suppl DEVI 1 each by Does not apply route daily before breakfast. May substitute to any manufacturer covered by patient's insurance.   gabapentin  (NEURONTIN ) 100 MG capsule Take 1-2 capsules (100-200 mg total) by mouth at bedtime as needed.   Glucose Blood (BLOOD GLUCOSE TEST STRIPS) STRP 1 each by In Vitro route daily before breakfast. May substitute to any manufacturer covered by patient's insurance.   insulin  glargine (LANTUS  SOLOSTAR) 100 UNIT/ML Solostar Pen Inject 48 Units into the skin daily. Increase by 2 units every 3 days until blood sugars are less than 140, or until maximum 60 units daily.   Insulin  Pen Needle 31G X 6 MM MISC 1 each by Does not apply route in the morning and at bedtime.   INSULIN  SYRINGE 1CC/29G 29G X 1/2 1 ML MISC 1 each by Does not apply route in the morning and at bedtime.   Lancet Device MISC 1 each by Does not apply route daily before breakfast. May substitute to any manufacturer covered by patient's insurance.   losartan -hydrochlorothiazide (HYZAAR) 100-25 MG tablet Take 1 tablet by mouth daily.   metFORMIN  (GLUCOPHAGE -XR) 500 MG 24 hr  tablet Take 2 tablets (1,000 mg total) by mouth 2 (two) times daily with a meal.   omeprazole (PRILOSEC OTC) 20 MG tablet Take 20 mg by mouth daily before breakfast.   [DISCONTINUED] Semaglutide ,0.25 or 0.5MG /DOS, 2 MG/3ML SOPN Inject 0.25 mg into the skin once a week.   No facility-administered medications prior to visit.        Objective    BP 113/80 (BP Location: Left Arm, Cuff Size: Large)   Pulse 88   Temp 98.3 F (36.8 C) (Oral)   Ht  5' 10 (1.778 m)   Wt 293 lb 12.8 oz (133.3 kg)   SpO2 98%   BMI 42.16 kg/m     Physical Exam Vitals and nursing note reviewed.  Constitutional:      General: He is not in acute distress.    Appearance: Normal appearance.  HENT:     Head: Normocephalic and atraumatic.  Eyes:     General: No scleral icterus.    Conjunctiva/sclera: Conjunctivae normal.  Cardiovascular:     Rate and Rhythm: Normal rate.  Pulmonary:     Effort: Pulmonary effort is normal.  Neurological:     Mental Status: He is alert and oriented to person, place, and time. Mental status is at baseline.  Psychiatric:        Mood and Affect: Mood normal.        Behavior: Behavior normal.      No results found for any visits on 11/24/23.  Assessment & Plan    Type 2 diabetes mellitus with diabetic neuropathy, with long-term current use of insulin  (HCC) Assessment & Plan: Managed with semaglutide , metformin , and Lantus  (prescribed as 48 units daily; taken as 24 units twice daily). Reports gastrointestinal side effects (burping). Blood sugar well-controlled on irregular checks. A1c expected to improve. Discussed potential switch to Mounjaro if side effects persist. - Continue semaglutide  0.5 mg weekly. - Reduce metformin  to two tablets in the morning and one at night. - Reduce Lantus  by 2 to 3 units if blood sugar noted to be <70 mg/dL. - Order A1c, metabolic panel, and cholesterol tests. - Encouraged regular eye exams.  Orders: -     Lipid panel -     Hemoglobin A1c -     Basic metabolic panel with GFR -     Semaglutide (0.25 or 0.5MG /DOS); Inject 0.5 mg into the skin once a week.  Dispense: 3 mL; Refill: 2  Hypertension associated with diabetes (HCC) Assessment & Plan: Chronic, stable on losartan -hydrochlorothiazide 100-25 mg daily. Mildly elevated today. Blood pressure not regularly monitored at home. Consistent medication adherence. Weight loss expected to aid control. - Rechecked blood pressure during  visit with noted improvement. - Continue current antihypertensive regimen.   Morbid obesity (HCC) Assessment & Plan: Lost seven pounds, walking two miles daily and making significant dietary changes. Weight loss expected to improve health outcomes. - Continue current diet and exercise regimen.   Hyperlipidemia associated with type 2 diabetes mellitus (HCC) Assessment & Plan: Noted.  LDL above goal of <70, given concomitant diabetes.  Discussed this with patient and recommendation to start cholesterol medication.  Patient declined today, preferring to continue to focus on lifestyle modifications, along with his Ozempic , which is likely to aid in weight loss, with expected related improvements in his cholesterol.  Continue to monitor.     Return in about 3 months (around 02/24/2024) for DM, HTN.      I discussed the assessment and treatment plan with the patient  The patient was provided an opportunity to ask questions and all were answered. The patient agreed with the plan and demonstrated an understanding of the instructions.   The patient was advised to call back or seek an in-person evaluation if the symptoms worsen or if the condition fails to improve as anticipated.    LAURAINE LOISE BUOY, DO  [facility] [phone] (phone) [phone] (fax)  [facility]

## 2023-11-24 NOTE — Patient Instructions (Signed)
 Reduce metformin  to 2 tablets in the morning and 1 tablet at night.

## 2023-11-24 NOTE — Assessment & Plan Note (Signed)
 Lost seven pounds, walking two miles daily and making significant dietary changes. Weight loss expected to improve health outcomes. - Continue current diet and exercise regimen.

## 2023-11-24 NOTE — Assessment & Plan Note (Signed)
 Noted.  LDL above goal of <70, given concomitant diabetes.  Discussed this with patient and recommendation to start cholesterol medication.  Patient declined today, preferring to continue to focus on lifestyle modifications, along with his Ozempic , which is likely to aid in weight loss, with expected related improvements in his cholesterol.  Continue to monitor.

## 2023-11-25 LAB — BASIC METABOLIC PANEL WITH GFR
BUN/Creatinine Ratio: 18 (ref 9–20)
BUN: 17 mg/dL (ref 6–20)
CO2: 28 mmol/L (ref 20–29)
Calcium: 9.9 mg/dL (ref 8.7–10.2)
Chloride: 100 mmol/L (ref 96–106)
Creatinine, Ser: 0.96 mg/dL (ref 0.76–1.27)
Glucose: 137 mg/dL — ABNORMAL HIGH (ref 70–99)
Potassium: 4.1 mmol/L (ref 3.5–5.2)
Sodium: 142 mmol/L (ref 134–144)
eGFR: 106 mL/min/1.73 (ref >59)

## 2023-11-25 LAB — LIPID PANEL
Chol/HDL Ratio: 4.4 ratio (ref 0.0–5.0)
Cholesterol, Total: 128 mg/dL (ref 100–199)
HDL: 29 mg/dL — ABNORMAL LOW (ref >39)
LDL Chol Calc (NIH): 65 mg/dL (ref 0–99)
Triglycerides: 202 mg/dL — ABNORMAL HIGH (ref 0–149)
VLDL Cholesterol Cal: 34 mg/dL (ref 5–40)

## 2023-11-25 LAB — HEMOGLOBIN A1C
Est. average glucose Bld gHb Est-mCnc: 146 mg/dL
Hgb A1c MFr Bld: 6.7 % — ABNORMAL HIGH (ref 4.8–5.6)

## 2023-12-06 ENCOUNTER — Ambulatory Visit: Payer: Self-pay | Admitting: Family Medicine

## 2024-01-29 DIAGNOSIS — E1165 Type 2 diabetes mellitus with hyperglycemia: Secondary | ICD-10-CM

## 2024-01-31 NOTE — Telephone Encounter (Signed)
 Requested medications are due for refill today.  yes  Requested medications are on the active medications list.  yes  Last refill. 06/09/2023 #360 1 rf  Future visit scheduled.   yes  Notes to clinic.  Expired labs.     Requested Prescriptions  Pending Prescriptions Disp Refills   metFORMIN  (GLUCOPHAGE -XR) 500 MG 24 hr tablet [Pharmacy Med Name: METFORMIN  ER 500 MG TAB (GLUCOPHAGE )] 360 tablet 1    Sig: TAKE TWO TABLETS BY MOUTH TWICE A DAY WITH A MEL     Endocrinology:  Diabetes - Biguanides Failed - 01/31/2024 12:43 PM      Failed - CBC within normal limits and completed in the last 12 months    WBC  Date Value Ref Range Status  12/01/2022 8.9 4.0 - 10.5 K/uL Final   RBC  Date Value Ref Range Status  12/01/2022 4.85 4.22 - 5.81 MIL/uL Final   Hemoglobin  Date Value Ref Range Status  12/01/2022 13.5 13.0 - 17.0 g/dL Final  90/72/7976 84.6 13.0 - 17.7 g/dL Final   HCT  Date Value Ref Range Status  12/01/2022 40.0 39.0 - 52.0 % Final   Hematocrit  Date Value Ref Range Status  10/01/2021 44.0 37.5 - 51.0 % Final   MCHC  Date Value Ref Range Status  12/01/2022 33.8 30.0 - 36.0 g/dL Final   [facility]  Date Value Ref Range Status  12/01/2022 27.8 26.0 - 34.0 pg Final   MCV  Date Value Ref Range Status  12/01/2022 82.5 80.0 - 100.0 fL Final  10/01/2021 82 79 - 97 fL Final   No results found for: PLTCOUNTKUC, LABPLAT, POCPLA RDW  Date Value Ref Range Status  12/01/2022 13.2 11.5 - 15.5 % Final  10/01/2021 13.2 11.6 - 15.4 % Final         Passed - Cr in normal range and within 360 days    Creatinine, Ser  Date Value Ref Range Status  11/24/2023 0.96 0.76 - 1.27 mg/dL Final         Passed - HBA1C is between 0 and 7.9 and within 180 days    HbA1c POC (<> result, manual entry)  Date Value Ref Range Status  05/04/2022 10.2 (A) 4.0 - 5.6 % Final   Hgb A1c MFr Bld  Date Value Ref Range Status  11/24/2023 6.7 (H) 4.8 - 5.6 % Final    Comment:              Prediabetes: 5.7 - 6.4          Diabetes: >6.4          Glycemic control for adults with diabetes: <7.0          Passed - eGFR in normal range and within 360 days    GFR calc Af Amer  Date Value Ref Range Status  12/03/2018 >60 >60 mL/min Final   GFR, Estimated  Date Value Ref Range Status  12/01/2022 >60 >60 mL/min Final    Comment:    (NOTE) Calculated using the CKD-EPI Creatinine Equation (2021)    eGFR  Date Value Ref Range Status  11/24/2023 106 >59 mL/min/1.73 Final         Passed - B12 Level in normal range and within 720 days    Vitamin B-12  Date Value Ref Range Status  03/16/2023 442 232 - 1,245 pg/mL Final         Passed - Valid encounter within last 6 months    Recent Outpatient Visits  2 months ago Type 2 diabetes mellitus with diabetic neuropathy, with long-term current use of insulin  [facility])   [facility] [place], Lauraine SAILOR, DO   4 months ago Type 2 diabetes mellitus with diabetic neuropathy, with long-term current use of insulin  [facility])   [facility] [place], Lauraine SAILOR, DO   6 months ago Neuropathy of right thigh   [facility] [place], Lauraine SAILOR, DO   10 months ago Type 2 diabetes mellitus with diabetic neuropathy, with long-term current use of insulin  [facility])   [facility] [place], Lauraine SAILOR, DO

## 2024-02-03 ENCOUNTER — Other Ambulatory Visit: Payer: Self-pay | Admitting: Family Medicine

## 2024-02-03 DIAGNOSIS — E119 Type 2 diabetes mellitus without complications: Secondary | ICD-10-CM

## 2024-02-25 ENCOUNTER — Ambulatory Visit: Admitting: Family Medicine
